# Patient Record
Sex: Male | Born: 1985
Health system: Southern US, Community
[De-identification: ages and names within clinical notes are randomized; demographics above are authoritative.]

## PROBLEM LIST (undated history)

## (undated) ENCOUNTER — Ambulatory Visit (HOSPITAL_COMMUNITY): Admission: EM | Discharge status: Absent without leave | Payer: Managed Care, Other (non HMO)

## (undated) DIAGNOSIS — J45909 Unspecified asthma, uncomplicated: Secondary | ICD-10-CM

## (undated) HISTORY — PX: HERNIA REPAIR: SHX51

---
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
## 2010-02-17 ENCOUNTER — Emergency Department (HOSPITAL_COMMUNITY): Admission: EM | Admit: 2010-02-17 | Discharge: 2010-02-17 | Payer: Self-pay | Admitting: Emergency Medicine

## 2011-03-17 ENCOUNTER — Emergency Department (HOSPITAL_COMMUNITY)
Admission: EM | Admit: 2011-03-17 | Discharge: 2011-03-17 | Disposition: A | Discharge status: Home / Self Care | Payer: BC Managed Care – PPO | Attending: Emergency Medicine | Admitting: Emergency Medicine

## 2011-03-17 ENCOUNTER — Other Ambulatory Visit: Payer: Self-pay

## 2011-03-17 ENCOUNTER — Emergency Department (HOSPITAL_COMMUNITY): Payer: BC Managed Care – PPO

## 2011-03-17 DIAGNOSIS — R059 Cough, unspecified: Secondary | ICD-10-CM | POA: Insufficient documentation

## 2011-03-17 DIAGNOSIS — R05 Cough: Secondary | ICD-10-CM | POA: Insufficient documentation

## 2011-03-17 DIAGNOSIS — J45901 Unspecified asthma with (acute) exacerbation: Secondary | ICD-10-CM

## 2011-03-17 DIAGNOSIS — R000 Tachycardia, unspecified: Secondary | ICD-10-CM | POA: Insufficient documentation

## 2011-03-17 DIAGNOSIS — R55 Syncope and collapse: Secondary | ICD-10-CM | POA: Insufficient documentation

## 2011-03-17 DIAGNOSIS — R0789 Other chest pain: Secondary | ICD-10-CM | POA: Insufficient documentation

## 2011-03-17 DIAGNOSIS — R0602 Shortness of breath: Secondary | ICD-10-CM | POA: Insufficient documentation

## 2011-03-17 MED ORDER — ALBUTEROL SULFATE (5 MG/ML) 0.5% IN NEBU
2.5000 mg | INHALATION_SOLUTION | Freq: Once | RESPIRATORY_TRACT | Status: AC
  Administered 2011-03-17: 2.5 mg via RESPIRATORY_TRACT
  Filled 2011-03-17: qty 0.5

## 2011-03-17 MED ORDER — ALBUTEROL SULFATE (5 MG/ML) 0.5% IN NEBU
2.5000 mg | INHALATION_SOLUTION | Freq: Once | RESPIRATORY_TRACT | Status: AC
  Administered 2011-03-17: 5 mg via RESPIRATORY_TRACT
  Filled 2011-03-17: qty 1

## 2011-03-17 MED ORDER — IPRATROPIUM BROMIDE 0.02 % IN SOLN
0.5000 mg | Freq: Once | RESPIRATORY_TRACT | Status: AC
  Administered 2011-03-17: 0.5 mg via RESPIRATORY_TRACT
  Filled 2011-03-17: qty 2.5

## 2011-03-17 MED ORDER — METHYLPREDNISOLONE SODIUM SUCC 125 MG IJ SOLR
125.0000 mg | Freq: Once | INTRAMUSCULAR | Status: AC
  Administered 2011-03-17: 125 mg via INTRAVENOUS
  Filled 2011-03-17: qty 2

## 2011-03-17 MED ORDER — PREDNISONE 10 MG PO TABS: 50.0000 mg | ORAL_TABLET | Freq: Every day | ORAL | Status: AC

## 2011-03-17 MED ORDER — ALBUTEROL SULFATE (5 MG/ML) 0.5% IN NEBU
2.5000 mg | INHALATION_SOLUTION | Freq: Once | RESPIRATORY_TRACT | Status: AC
  Administered 2011-03-17: 2.5 mg via RESPIRATORY_TRACT
  Filled 2011-03-17: qty 1

## 2011-03-17 MED ORDER — ALBUTEROL SULFATE HFA 108 (90 BASE) MCG/ACT IN AERS
2.0000 | INHALATION_SPRAY | Freq: Four times a day (QID) | RESPIRATORY_TRACT | Status: DC
  Administered 2011-03-17: 2 via RESPIRATORY_TRACT
  Filled 2011-03-17: qty 6.7

## 2011-03-17 NOTE — ED Notes (Signed)
Pt reports sob and wheezing with coughing x2 weeks. States he had asthma as a child but has not had any problems recently until now. States this feels like an asthma attack.

## 2011-03-17 NOTE — ED Notes (Signed)
Patient is resting comfortably. 

## 2011-03-17 NOTE — ED Provider Notes (Signed)
History     CSN: [id number]  Arrival date & time 03/17/11  1538   First MD Initiated Contact with Patient 03/17/11 1558      Chief Complaint  Patient presents with  . Shortness of Breath    wheezing.  hx asthma as a child  . Cough    clear sputum    (Consider location/radiation/quality/duration/timing/severity/associated sxs/prior treatment) HPI The patient presents with shortness of breath. He notes the gradual onset of symptoms approximately 10 days ago. Since onset his symptoms have been persistent, worsening clear provoking factor. No relief with anything. The patient is concurrent tightness in his chest, focally about the sternum. No exertional worsening of this pain, nor any nausea/vomiting. The patient notes that after he was particularly short of breath yesterday he had a syncopal episode. He denies any head pain prior to or following the event. With the patient has a history of childhood asthma, but no current medication use. He recalls that this episode is similar to those he experienced as a child History reviewed. No pertinent past medical history.  Past Surgical History  Procedure Date  . Hernia repair     History reviewed. No pertinent family history.  History  Substance Use Topics  . Smoking status: Current Everyday Smoker -- 1.0 packs/day    Types: Cigarettes  . Smokeless tobacco: Not on file  . Alcohol Use: Yes     40 oz every few days      Review of Systems  Constitutional:       Per HPI, otherwise negative  HENT:       Per HPI, otherwise negative  Eyes: Negative.   Respiratory:       Per HPI, otherwise negative  Cardiovascular:       Per HPI, otherwise negative  Gastrointestinal: Negative for vomiting.  Genitourinary: Negative.   Musculoskeletal:       Per HPI, otherwise negative  Skin: Negative.   Neurological: Negative for syncope.    Allergies  Review of patient's allergies indicates no known allergies.  Home Medications  No  current outpatient prescriptions on file.  BP 142/80  Pulse 102  Temp(Src) 98.8 F (37.1 C) (Oral)  Resp 22  SpO2 99%  Physical Exam  Nursing note and vitals reviewed. Constitutional: He is oriented to person, place, and time. He appears well-developed. No distress.  HENT:  Head: Normocephalic and atraumatic.  Eyes: Conjunctivae and EOM are normal.  Cardiovascular: Regular rhythm.  Tachycardia present.   Pulmonary/Chest: Effort normal. No accessory muscle usage or stridor. Not tachypneic. No respiratory distress. He has decreased breath sounds. He has wheezes.  Abdominal: He exhibits no distension.  Musculoskeletal: He exhibits no edema.  Neurological: He is alert and oriented to person, place, and time.  Skin: Skin is warm and dry.  Psychiatric: He has a normal mood and affect.    ED Course  Procedures (including critical care time)  Labs Reviewed - No data to display No results found.   No diagnosis found.  Cardiac monitor 88, sinus rhythm, normal Pulse ox 90%, room air, normal  Date: 03/17/2011  Rate: 86  Rhythm: normal sinus rhythm  QRS Axis: borderline RAD  Intervals: normal  ST/T Wave abnormalities: normal  Conduction Disutrbances:none  Narrative Interpretation:   Old EKG Reviewed: none available [place] [facility] - abnormal ECG  5:24 PM Patient notes improvement.  Lungs sound more clear.  Neb #2 ordered  6:21 PM Patient is breathing w/o wheezing, no stridor, and tolerating PO w/o discomfort.  CXR: reviewed by me, no acute findings MDM  Male with a distant history of asthma now presents with several days of shortness of breath and cough. Initially the patient has notable bilateral wheezing. Following albuterol treatments, Solu-Medrol the patient's breathing improved substantially. He was discharged home in stable condition with an albuterol inhaler        Gerhard Munch, MD 03/17/11 [phone]

## 2011-03-17 NOTE — ED Notes (Signed)
Family at bedside. 

## 2011-03-17 NOTE — ED Notes (Signed)
Pt reports 2 days of increased SOB and sternal chest pain that is worse with deep inspiration. Chest pain radiatess to shoulders and back. Pt denies dyspnea on exertion and nausea. Chest pain is associated with dizziness and weakness.

## 2011-08-17 ENCOUNTER — Emergency Department (HOSPITAL_COMMUNITY): Payer: No Typology Code available for payment source

## 2011-08-17 ENCOUNTER — Encounter (HOSPITAL_COMMUNITY): Payer: Self-pay | Admitting: Emergency Medicine

## 2011-08-17 ENCOUNTER — Emergency Department (HOSPITAL_COMMUNITY)
Admission: EM | Admit: 2011-08-17 | Discharge: 2011-08-17 | Disposition: A | Discharge status: Home / Self Care | Payer: No Typology Code available for payment source | Attending: Emergency Medicine | Admitting: Emergency Medicine

## 2011-08-17 DIAGNOSIS — M542 Cervicalgia: Secondary | ICD-10-CM | POA: Insufficient documentation

## 2011-08-17 DIAGNOSIS — M545 Low back pain, unspecified: Secondary | ICD-10-CM | POA: Insufficient documentation

## 2011-08-17 DIAGNOSIS — S161XXA Strain of muscle, fascia and tendon at neck level, initial encounter: Secondary | ICD-10-CM

## 2011-08-17 DIAGNOSIS — S139XXA Sprain of joints and ligaments of unspecified parts of neck, initial encounter: Secondary | ICD-10-CM | POA: Insufficient documentation

## 2011-08-17 DIAGNOSIS — S39012A Strain of muscle, fascia and tendon of lower back, initial encounter: Secondary | ICD-10-CM

## 2011-08-17 DIAGNOSIS — IMO0001 Reserved for inherently not codable concepts without codable children: Secondary | ICD-10-CM | POA: Insufficient documentation

## 2011-08-17 DIAGNOSIS — S335XXA Sprain of ligaments of lumbar spine, initial encounter: Secondary | ICD-10-CM | POA: Insufficient documentation

## 2011-08-17 DIAGNOSIS — M7989 Other specified soft tissue disorders: Secondary | ICD-10-CM | POA: Insufficient documentation

## 2011-08-17 DIAGNOSIS — F17200 Nicotine dependence, unspecified, uncomplicated: Secondary | ICD-10-CM | POA: Insufficient documentation

## 2011-08-17 MED ORDER — IBUPROFEN 800 MG PO TABS: 800.0000 mg | ORAL_TABLET | Freq: Three times a day (TID) | ORAL | Status: AC | PRN

## 2011-08-17 MED ORDER — KETOROLAC TROMETHAMINE 60 MG/2ML IM SOLN
60.0000 mg | Freq: Once | INTRAMUSCULAR | Status: AC
  Administered 2011-08-17: 60 mg via INTRAMUSCULAR
  Filled 2011-08-17: qty 2

## 2011-08-17 MED ORDER — CYCLOBENZAPRINE HCL 10 MG PO TABS: 10.0000 mg | ORAL_TABLET | Freq: Three times a day (TID) | ORAL | Status: AC | PRN

## 2011-08-17 MED ORDER — HYDROCODONE-ACETAMINOPHEN 5-325 MG PO TABS
1.0000 | ORAL_TABLET | Freq: Once | ORAL | Status: AC
  Administered 2011-08-17: 1 via ORAL
  Filled 2011-08-17: qty 1

## 2011-08-17 MED ORDER — HYDROCODONE-ACETAMINOPHEN 5-325 MG PO TABS: 1.0000 | ORAL_TABLET | Freq: Four times a day (QID) | ORAL | Status: AC | PRN
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
## 2011-08-17 NOTE — Discharge Instructions (Signed)
Return here as needed for any worsening in her condition.  Use ice and heat on her neck and back.  You can expect to be sore for the next 7-10 days
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
## 2011-08-18 NOTE — ED Provider Notes (Signed)
Medical screening examination/treatment/procedure(s) were performed by non-physician practitioner and as supervising physician I was immediately available for consultation/collaboration.  Rebel Willcutt L Calyx Hawker, MD 08/18/11 0904 

## 2016-07-09 ENCOUNTER — Encounter (HOSPITAL_COMMUNITY): Payer: Self-pay | Admitting: *Deleted

## 2016-07-09 ENCOUNTER — Emergency Department (HOSPITAL_COMMUNITY)
Admission: EM | Admit: 2016-07-09 | Discharge: 2016-07-09 | Disposition: A | Discharge status: Home / Self Care | Payer: Self-pay | Attending: Emergency Medicine | Admitting: Emergency Medicine

## 2016-07-09 DIAGNOSIS — R369 Urethral discharge, unspecified: Secondary | ICD-10-CM | POA: Insufficient documentation

## 2016-07-09 DIAGNOSIS — F17210 Nicotine dependence, cigarettes, uncomplicated: Secondary | ICD-10-CM | POA: Insufficient documentation

## 2016-07-09 DIAGNOSIS — F1290 Cannabis use, unspecified, uncomplicated: Secondary | ICD-10-CM | POA: Insufficient documentation

## 2016-07-09 LAB — URINALYSIS, ROUTINE W REFLEX MICROSCOPIC
BILIRUBIN URINE: NEGATIVE
GLUCOSE, UA: NEGATIVE mg/dL
Hgb urine dipstick: NEGATIVE
Ketones, ur: NEGATIVE mg/dL
NITRITE: NEGATIVE
PH: 5 (ref 5.0–8.0)
Protein, ur: NEGATIVE mg/dL
RBC / HPF: NONE SEEN RBC/hpf (ref 0–5)
SPECIFIC GRAVITY, URINE: 1.015 (ref 1.005–1.030)
SQUAMOUS EPITHELIAL / LPF: NONE SEEN

## 2016-07-09 MED ORDER — CEFTRIAXONE SODIUM 250 MG IJ SOLR
250.0000 mg | Freq: Once | INTRAMUSCULAR | Status: AC
  Administered 2016-07-09: 250 mg via INTRAMUSCULAR
  Filled 2016-07-09: qty 250

## 2016-07-09 MED ORDER — AZITHROMYCIN 250 MG PO TABS
1000.0000 mg | ORAL_TABLET | Freq: Once | ORAL | Status: AC
  Administered 2016-07-09: 1000 mg via ORAL
  Filled 2016-07-09: qty 4

## 2016-07-09 NOTE — ED Triage Notes (Signed)
Pt c/o penile discharge, recently diagnosed with chlamydia, states that he was given a shot of medication but symptoms have remained,

## 2016-07-09 NOTE — ED Notes (Signed)
No answer in waiting room X1,  

## 2016-07-09 NOTE — ED Provider Notes (Signed)
[facility]-EMER[facility]ENCY DEPT Provider Note   CSN: [id number] Arrival date & time: 07/09/16  2040     History   Chief Complaint Chief Complaint  Patient presents with  . Penile Discharge    HPI Dennis Salinas is a 31 y.o. male who presents with 4 days of worsening penile discharge. He states that discharge is yellow in color. He reports that he was treated for chlamydia a few weeks ago and had one shot of medication for it. He reports that he has had sexual intercourse since then but has used condoms. He denies any hematuria, dysuria, penile swelling, testicular pain or swelling, abdominal pain or vomiting.   The history is provided by the patient.    History reviewed. No pertinent past medical history.  There are no active problems to display for this patient.   Past Surgical History:  Procedure Laterality Date  . HERNIA REPAIR         Home Medications    Prior to Admission medications   Not on File    Family History No family history on file.  Social History Social History  Substance Use Topics  . Smoking status: Current Every Day Smoker    Packs/day: 1.00    Types: Cigarettes  . Smokeless tobacco: Never Used  . Alcohol use Yes     Comment: 40 oz every few days     Allergies   Patient has no known allergies.   Review of Systems Review of Systems  Constitutional: Negative for fever.  Respiratory: Negative for shortness of breath.   Cardiovascular: Negative for chest pain.  Gastrointestinal: Negative for nausea and vomiting.  Genitourinary: Positive for discharge. Negative for dysuria, hematuria, penile pain, penile swelling, scrotal swelling and testicular pain.     Physical Exam Updated Vital Signs BP (!) 151/88 (BP Location: Right Arm)   Pulse 81   Temp 98.5 F (36.9 C) (Oral)   Resp 20   Ht  (1.905 m)   Wt 122.5 kg   SpO2 96%   BMI 33.75 kg/m   Physical Exam  Constitutional: He appears well-developed and well-nourished.  HENT:    Head: Normocephalic and atraumatic.  Eyes: Conjunctivae and EOM are normal. Right eye exhibits no discharge. Left eye exhibits no discharge. No scleral icterus.  Pulmonary/Chest: Effort normal.  Abdominal: Soft. Hernia confirmed negative in the right inguinal area and confirmed negative in the left inguinal area.  Genitourinary: Testes normal and penis normal. Right testis shows no swelling and no tenderness. Left testis shows no swelling and no tenderness. Circumcised. No penile tenderness. No discharge found.  Genitourinary Comments: The exam was performed with a chaperone present. No ulcers or sores noted to penis. No discharge noted.   Musculoskeletal: He exhibits no deformity.  Neurological: He is alert.  Skin: Skin is warm and dry.  Psychiatric: He has a normal mood and affect. His speech is normal and behavior is normal.     ED Treatments / Results  Labs (all labs ordered are listed, but only abnormal results are displayed) Labs Reviewed  URINALYSIS, ROUTINE W REFLEX MICROSCOPIC - Abnormal; Notable for the following:       Result Value   Leukocytes, UA TRACE (*)    Bacteria, UA RARE (*)    All other components within normal limits  RPR  HIV ANTIBODY (ROUTINE TESTING)  GC/CHLAMYDIA PROBE AMP ([place]) NOT AT [facility]    EKG  EKG Interpretation None       Radiology No results found.  Procedures Procedures (including critical care time)  Medications Ordered in ED Medications  cefTRIAXone (ROCEPHIN) injection 250 mg (250 mg Intramuscular Given 07/09/16 2216)  azithromycin (ZITHROMAX) tablet 1,000 mg (1,000 mg Oral Given 07/09/16 2216)     Initial Impression / Assessment and Plan / ED Course  I have reviewed the triage vital signs and the nursing notes.  Pertinent labs & imaging results that were available during my care of the patient were reviewed by me and considered in my medical decision making (see chart for details).     31 yo M presents with penile  discharge. Recently treated for chlamydia. Questionable if treated for gonorrhea at the same time since he only remembers getting on medication. Normal GU exam. Will check STI panel on him. Since he is symptomatic will plan to treat him in the department.    Instructed patient to follow-up with PCP in 2 days. Provided patient with a list of clinic resources to use if he does not have a PCP. Instructed to call them today to arrange follow-up in the next 24-48 hours.  Return precautions discussed. Patient expresses understanding and agreement to plan.    Final Clinical Impressions(s) / ED Diagnoses   Final diagnoses:  Penile discharge    New Prescriptions There are no discharge medications for this patient.    Maxwell Caul, PA-C 07/10/16 0131    Bethann Berkshire, MD 07/15/16 [phone]

## 2016-07-09 NOTE — Discharge Instructions (Signed)
We have treated you today for a sexually transmitted infection since you are having symptoms. The results of the test will take a few days to come back. You will get a call if anything is abnormal or you can use the mychart system to access your results.   Follow-up with a primary care doctor in 3-4 days as needed.   Return to the Emergency Department for any worsening pain, swelling, fevers, or any other concerns.   If you do not have a primary care doctor you see regularly, please you the list below. Please call them to arrange for follow-up.    No Primary Care Doctor Call [facility]  [phone] Other agencies that provide inexpensive medical care    [facility]  [phone]    [facility]  [phone]    [facility]  [phone]    [facility]  [phone]    [facility]  [phone]    [facility]  [phone]

## 2016-07-09 NOTE — ED Notes (Signed)
No answer in waiting room,  

## 2016-07-11 LAB — GC/CHLAMYDIA PROBE AMP (~~LOC~~) NOT AT ARMC
Chlamydia: NEGATIVE
Neisseria Gonorrhea: NEGATIVE

## 2016-07-11 LAB — HIV ANTIBODY (ROUTINE TESTING W REFLEX): HIV Screen 4th Generation wRfx: NONREACTIVE

## 2016-07-11 LAB — RPR: RPR Ser Ql: NONREACTIVE

## 2016-11-21 ENCOUNTER — Emergency Department (HOSPITAL_COMMUNITY): Payer: No Typology Code available for payment source

## 2016-11-21 ENCOUNTER — Emergency Department (HOSPITAL_COMMUNITY)
Admission: EM | Admit: 2016-11-21 | Discharge: 2016-11-21 | Disposition: A | Discharge status: Home / Self Care | Payer: No Typology Code available for payment source | Attending: Emergency Medicine | Admitting: Emergency Medicine

## 2016-11-21 ENCOUNTER — Encounter (HOSPITAL_COMMUNITY): Payer: Self-pay | Admitting: *Deleted

## 2016-11-21 DIAGNOSIS — S61211A Laceration without foreign body of left index finger without damage to nail, initial encounter: Secondary | ICD-10-CM | POA: Diagnosis not present

## 2016-11-21 DIAGNOSIS — Z23 Encounter for immunization: Secondary | ICD-10-CM | POA: Insufficient documentation

## 2016-11-21 DIAGNOSIS — Y998 Other external cause status: Secondary | ICD-10-CM | POA: Diagnosis not present

## 2016-11-21 DIAGNOSIS — S61217A Laceration without foreign body of left little finger without damage to nail, initial encounter: Secondary | ICD-10-CM | POA: Diagnosis present

## 2016-11-21 DIAGNOSIS — Y92410 Unspecified street and highway as the place of occurrence of the external cause: Secondary | ICD-10-CM | POA: Diagnosis not present

## 2016-11-21 DIAGNOSIS — Y939 Activity, unspecified: Secondary | ICD-10-CM | POA: Insufficient documentation

## 2016-11-21 DIAGNOSIS — F17210 Nicotine dependence, cigarettes, uncomplicated: Secondary | ICD-10-CM | POA: Insufficient documentation

## 2016-11-21 MED ORDER — TETANUS-DIPHTH-ACELL PERTUSSIS 5-2.5-18.5 LF-MCG/0.5 IM SUSP
0.5000 mL | Freq: Once | INTRAMUSCULAR | Status: AC
  Administered 2016-11-21: 0.5 mL via INTRAMUSCULAR
  Filled 2016-11-21: qty 0.5

## 2016-11-21 MED ORDER — ACETAMINOPHEN 325 MG PO TABS
325.0000 mg | ORAL_TABLET | Freq: Once | ORAL | Status: AC
  Administered 2016-11-21: 325 mg via ORAL
  Filled 2016-11-21: qty 1

## 2016-11-21 MED ORDER — OXYCODONE-ACETAMINOPHEN 5-325 MG PO TABS
ORAL_TABLET | ORAL | Status: AC
  Filled 2016-11-21: qty 1

## 2016-11-21 MED ORDER — OXYCODONE-ACETAMINOPHEN 5-325 MG PO TABS
1.0000 | ORAL_TABLET | Freq: Once | ORAL | Status: AC
  Administered 2016-11-21: 1 via ORAL
  Filled 2016-11-21: qty 1

## 2016-11-21 MED ORDER — LIDOCAINE HCL (PF) 1 % IJ SOLN
30.0000 mL | Freq: Once | INTRAMUSCULAR | Status: AC
  Administered 2016-11-21: 30 mL
  Filled 2016-11-21: qty 30

## 2016-11-21 MED ORDER — OXYCODONE-ACETAMINOPHEN 5-325 MG PO TABS
1.0000 | ORAL_TABLET | ORAL | Status: DC | PRN
  Administered 2016-11-21: 1 via ORAL

## 2016-11-21 NOTE — ED Notes (Signed)
Claudia PA in room to suture pt's hand.
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
## 2016-11-21 NOTE — ED Notes (Signed)
Pt verbalized understanding of discharge instructions and has no further questions. [facility]. VSS, NAD. Patient discharged with a family member to drive

## 2016-11-21 NOTE — ED Notes (Signed)
Pt verbalized understanding of d/c instructions and has no further questions. Pt stable and nAD, VSS. Pt removed all belongings from room.

## 2016-11-21 NOTE — ED Notes (Signed)
Pt left hand soaking in betadine and sterile saline
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
## 2016-11-21 NOTE — ED Notes (Signed)
Pt transported to xray 

## 2016-12-13 ENCOUNTER — Encounter (HOSPITAL_COMMUNITY): Payer: Self-pay

## 2016-12-13 ENCOUNTER — Emergency Department (HOSPITAL_COMMUNITY)
Admission: EM | Admit: 2016-12-13 | Discharge: 2016-12-13 | Disposition: A | Discharge status: Home / Self Care | Payer: Self-pay | Attending: Emergency Medicine | Admitting: Emergency Medicine

## 2016-12-13 DIAGNOSIS — F17210 Nicotine dependence, cigarettes, uncomplicated: Secondary | ICD-10-CM | POA: Insufficient documentation

## 2016-12-13 DIAGNOSIS — N342 Other urethritis: Secondary | ICD-10-CM

## 2016-12-13 DIAGNOSIS — N341 Nonspecific urethritis: Secondary | ICD-10-CM | POA: Insufficient documentation

## 2016-12-13 LAB — URINALYSIS, ROUTINE W REFLEX MICROSCOPIC
BILIRUBIN URINE: NEGATIVE
Bacteria, UA: NONE SEEN
GLUCOSE, UA: NEGATIVE mg/dL
HGB URINE DIPSTICK: NEGATIVE
KETONES UR: NEGATIVE mg/dL
NITRITE: NEGATIVE
PH: 5 (ref 5.0–8.0)
PROTEIN: NEGATIVE mg/dL
SQUAMOUS EPITHELIAL / LPF: NONE SEEN
Specific Gravity, Urine: 1.012 (ref 1.005–1.030)

## 2016-12-13 MED ORDER — AZITHROMYCIN 250 MG PO TABS
1000.0000 mg | ORAL_TABLET | Freq: Once | ORAL | Status: AC
  Administered 2016-12-13: 1000 mg via ORAL
  Filled 2016-12-13: qty 4

## 2016-12-13 MED ORDER — LIDOCAINE HCL (PF) 1 % IJ SOLN
5.0000 mL | Freq: Once | INTRAMUSCULAR | Status: AC
  Administered 2016-12-13: 5 mL
  Filled 2016-12-13: qty 5

## 2016-12-13 MED ORDER — CEFTRIAXONE SODIUM 250 MG IJ SOLR
250.0000 mg | Freq: Once | INTRAMUSCULAR | Status: AC
  Administered 2016-12-13: 250 mg via INTRAMUSCULAR
  Filled 2016-12-13: qty 250

## 2016-12-13 NOTE — Discharge Instructions (Signed)
You have been treated in the emergency department for an infection, possibly sexually transmitted. Results of your gonorrhea and chlamydia tests are pending and you will be notified if they are positive. It is very important to practice safe sex and use condoms when sexually active. If your results are positive you need to notify all sexual partners so they can be treated as well. The website https://garcia.net/ can be used to send anonymous text messages or emails to alert sexual contacts. Follow up with your doctor, or OBGYN in regards to today's visit.    Gonorrhea and Chlamydia SYMPTOMS  In females, symptoms may go unnoticed. Symptoms that are more noticeable can include:  Belly (abdominal) pain.  Painful intercourse.  Watery mucous-like discharge from the vagina.  Miscarriage.  Discomfort when urinating.  Inflammation of the rectum.  Abnormal gray-green frothy vaginal discharge  Vaginal itching and irritatio  Itching and irritation of the area outside the vagina.   Painful urination.  Bleeding after sexual intercourse.  In males, symptoms include:  Burning with urination.  Pain in the testicles.  Watery mucous-like discharge from the penis.  It can cause longstanding (chronic) pelvic pain after frequent infections.  TREATMENT  PID can cause women to not be able to have children (sterile) if left untreated or if half-treated.  It is important to finish ALL medications given to you.  This is a sexually transmitted infection. So you are also at risk for other sexually transmitted diseases, including HIV (AIDS), it is recommended that you get tested. HOME CARE INSTRUCTIONS  Warning: This infection is contagious. Do not have sex until treatment is completed. Follow up at your caregiver's office or the clinic to which you were referred. If your diagnosis (learning what is wrong) is confirmed by culture or some other method, your recent sexual contacts need treatment. Even if they are  symptom free or have a negative culture or evaluation, they should be treated.  PREVENTION  Women should use sanitary pads instead of tampons for vaginal discharge.  Wipe front to back after using the toilet and avoid douching.   Practice safe sex, use condoms, have only one sex partner and be sure your sex partner is not having sex with others.  Ask your caregiver to test you for chlamydia at your regular checkups or sooner if you are having symptoms.  Ask for further information if you are pregnant.  SEEK IMMEDIATE MEDICAL CARE IF:  You develop an oral temperature above 102 F (38.9 C), not controlled by medications or lasting more than 2 days.  You develop an increase in pain.  You develop any type of abnormal discharge.  You develop vaginal bleeding and it is not time for your period.  You develop painful intercourse.   Free HIV and STD Testing These locations offer FREE confidential testing for HIV, Chlamydia, Gonorrhea, and Syphilis. Non-Traditional Testing Sites Address Telephone  [facility] [address] [place] [phone] Mondays 5pm - 7pm  [facility] [address] [place] [phone] Wednesdays 2pm-8pm  [employer] [facility] [address] [place] [phone] Thursdays 9am-12noon 1pm-4pm  [facility] [address] [place] [phone] Tuesdays Thursdays 9am-12noon 1pm-4pm  [facility] Department of [employer] offers free, confidential testing and treatment for HIV, Chlamydia, Gonorrhea, Syphilis, Herpes, Bacterial Vaginosis, Yeast, and Trichomoniasis. Traditional Testing   [facility]  Department-[place] [facility] [address], [place] [phone]  Monday thru Friday  Call for an appointment  [facility] Department- [place] [address] [place] [phone] Monday thru Friday  Call for anappointment.  If you have any questions about this information please call [phone]. 01/30/2011

## 2016-12-13 NOTE — ED Triage Notes (Signed)
Pt states he noticed clear discharge from his penis on Monday. No hx of STD but gets checked frequently and has been treated prophylactic in the past. He reports he would like to be checked and treated if needed.

## 2016-12-13 NOTE — ED Notes (Signed)
Pt reports clear penile discharge x 3 days.

## 2016-12-13 NOTE — ED Provider Notes (Signed)
[facility] Provider Note   CSN: [id number] Arrival date & time: 12/13/16  1034     History   Chief Complaint Chief Complaint  Patient presents with  . Exposure to STD    HPI Tequan Redmon is a 31 y.o. male who presents emergency Department with chief complaint of penile discharge. Patient has had 2 episodes of unprotected sexual intercourse with new partners. He noticed some clear discharge from his penis 2 days ago. He has had several episodes of discharge and this morning noticed some milky discharge from the penis. He denies any dysuria, scrotal tenderness, testicular tenderness, lesions.  HPI  History reviewed. No pertinent past medical history.  There are no active problems to display for this patient.   Past Surgical History:  Procedure Laterality Date  . HERNIA REPAIR         Home Medications    Prior to Admission medications   Not on File    Family History History reviewed. No pertinent family history.  Social History Social History  Substance Use Topics  . Smoking status: Current Every Day Smoker    Packs/day: 1.00    Types: Cigarettes  . Smokeless tobacco: Never Used  . Alcohol use Yes     Comment: 40 oz every few days     Allergies   Patient has no known allergies.   Review of Systems Review of Systems  Constitutional: Negative for chills and fever.  Genitourinary: Positive for discharge. Negative for genital sores.     Physical Exam Updated Vital Signs BP 133/77 (BP Location: Left Arm)   Pulse 64   Temp 98.1 F (36.7 C) (Oral)   Resp 20   SpO2 100%   Physical Exam  Constitutional: He appears well-developed and well-nourished. No distress.  HENT:  Head: Normocephalic and atraumatic.  Eyes: Conjunctivae are normal. No scleral icterus.  Neck: Normal range of motion. Neck supple.  Cardiovascular: Normal rate, regular rhythm and normal heart sounds.   Pulmonary/Chest: Effort normal and breath sounds normal. No  respiratory distress.  Abdominal: Soft. There is no tenderness.  Genitourinary:  Genitourinary Comments: Normal male genitalia, circumcised. No discharge at the meatus, no lesions, no testicular tenderness tenderness or swelling.  Musculoskeletal: He exhibits no edema.  Neurological: He is alert.  Skin: Skin is warm and dry. He is not diaphoretic.  Psychiatric: His behavior is normal.  Nursing note and vitals reviewed.    ED Treatments / Results  Labs (all labs ordered are listed, but only abnormal results are displayed) Labs Reviewed  URINALYSIS, ROUTINE W REFLEX MICROSCOPIC - Abnormal; Notable for the following:       Result Value   Leukocytes, UA SMALL (*)    All other components within normal limits  RPR  HIV ANTIBODY (ROUTINE TESTING)  GC/CHLAMYDIA PROBE AMP ([place]) NOT AT [facility]    EKG  EKG Interpretation None       Radiology No results found.  Procedures Procedures (including critical care time)  Medications Ordered in ED Medications  cefTRIAXone (ROCEPHIN) injection 250 mg (not administered)  azithromycin (ZITHROMAX) tablet 1,000 mg (not administered)  lidocaine (PF) (XYLOCAINE) 1 % injection 5 mL (not administered)     Initial Impression / Assessment and Plan / ED Course  I have reviewed the triage vital signs and the nursing notes.  Pertinent labs & imaging results that were available during my care of the patient were reviewed by me and considered in my medical decision making (see chart for details).  Patient treated in the ED for STI with Rocephin and azithromycin. Patient advised to inform and treat all sexual partners.  Pt advised on safe sex practices and understands that they have GC/Chlamydia cultures pending and will result in 2-3 days. HIV and RPR sent. Pt encouraged to follow up at local health department for future STI checks. No concern for prostatitis or epididymitis. Discussed return precautions. Pt appears safe for discharge.     Final Clinical Impressions(s) / ED Diagnoses   Final diagnoses:  Urethritis    New Prescriptions New Prescriptions   No medications on file     Delos Haring 12/13/16 1234    Benjiman Core, MD 12/13/16 [phone]

## 2016-12-14 LAB — RPR: RPR: NONREACTIVE

## 2016-12-14 LAB — HIV ANTIBODY (ROUTINE TESTING W REFLEX): HIV SCREEN 4TH GENERATION: NONREACTIVE

## 2017-10-20 DIAGNOSIS — K645 Perianal venous thrombosis: Secondary | ICD-10-CM | POA: Diagnosis not present

## 2017-11-24 ENCOUNTER — Ambulatory Visit: Payer: Self-pay | Admitting: Surgery

## 2017-11-24 DIAGNOSIS — K642 Third degree hemorrhoids: Secondary | ICD-10-CM | POA: Diagnosis not present

## 2017-11-24 NOTE — H&P (Signed)
Dennis Salinas Documented: 11/24/2017 9:11 AM Location: [facility] Patient #: [id number] DOB: 12-28-1985 Single / Language: [place] / Race: Black or African American Male  History of Present Illness [name] MD; 11/24/2017 12:36 PM) The patient is a 32 year old male who presents with hemorrhoids. Note for "Hemorrhoids": ` ` ` Patient sent for surgical consultation at the request of Dr Via  Chief Complaint: Prolapsing hemorrhoids with recent thrombosed episode ` ` The patient is a pleasant healthy male. X football player. He is struggle with hemorrhoid flares in his life. They can pop out and be irritating. Sometimes has to push them back in. Usually it is tolerable. However, last month he had an episode with a painful thrombosed hemorrhoid. It took some time but eventually went down. Given the fact that his father needed hemorrhoid surgery, he wished to consider surgical evaluation to see if something more aggressive can happen. He moves his bowels about every 2-3 times a day. Had an umbilical hernia surgery when he was 32 years old but otherwise no abdominal surgeries. He has not required any intervention such as banding or lysis of thrombosed hemorrhoids.  No personal nor family history of GI/colon cancer, inflammatory bowel disease, irritable bowel syndrome, allergy such as Celiac Sprue, dietary/dairy problems, colitis, ulcers nor gastritis. No recent sick contacts/gastroenteritis. No travel outside the country. No changes in diet. No dysphagia to solids or liquids. No significant heartburn or reflux. No hematochezia, hematemesis, coffee ground emesis. No evidence of prior gastric/peptic ulceration.  (Review of systems as stated in this history (HPI) or in the review of systems. Otherwise all other 12 point ROS are negative) ` ` `   Past Surgical History [name], CMA; 11/24/2017 9:11 AM) No pertinent past surgical history  Diagnostic  Studies History [name], CMA; 11/24/2017 9:11 AM) Colonoscopy never  Allergies [name], CMA; 11/24/2017 9:12 AM) No Known Drug Allergies [11/24/2017]:  Medication History [name], CMA; 11/24/2017 9:12 AM) No Current Medications  Social History [name], CMA; 11/24/2017 9:11 AM) Alcohol use Occasional alcohol use. Caffeine use Coffee, Tea. Tobacco use Current every day smoker.  Family History [name], [place]; 11/24/2017 9:11 AM) Diabetes Mellitus Mother. Hypertension Father, Mother.  Other Problems [name], CMA; 11/24/2017 9:11 AM) Asthma Hemorrhoids Umbilical Hernia Repair     Review of Systems [name] CMA; 11/24/2017 9:11 AM) General Not Present- Appetite Loss, Chills, Fatigue, Fever, Night Sweats, Weight Gain and Weight Loss. Skin Not Present- Change in Wart/Mole, Dryness, Hives, Jaundice, New Lesions, Non-Healing Wounds, Rash and Ulcer. HEENT Present- Seasonal Allergies and Wears glasses/contact lenses. Not Present- Earache, Hearing Loss, Hoarseness, Nose Bleed, Oral Ulcers, Ringing in the Ears, Sinus Pain, Sore Throat, Visual Disturbances and Yellow Eyes. Respiratory Not Present- Bloody sputum, Chronic Cough, Difficulty Breathing, Snoring and Wheezing. Breast Not Present- Breast Mass, Breast Pain, Nipple Discharge and Skin Changes. Cardiovascular Not Present- Chest Pain, Difficulty Breathing Lying Down, Leg Cramps, Palpitations, Rapid Heart Rate, Shortness of Breath and Swelling of Extremities. Gastrointestinal Present- Bloody Stool, Hemorrhoids and Rectal Pain. Not Present- Abdominal Pain, Bloating, Change in Bowel Habits, Chronic diarrhea, Constipation, Difficulty Swallowing, Excessive gas, Gets full quickly at meals, Indigestion, Nausea and Vomiting. Male Genitourinary Not Present- Blood in Urine, Change in Urinary Stream, Frequency, Impotence, Nocturia, Painful Urination, Urgency and Urine Leakage. Musculoskeletal Not  Present- Back Pain, Joint Pain, Joint Stiffness, Muscle Pain, Muscle Weakness and Swelling of Extremities. Neurological Not Present- Decreased Memory, Fainting, Headaches, Numbness, Seizures, Tingling, Tremor, Trouble walking and  Weakness. Psychiatric Not Present- Anxiety, Bipolar, Change in Sleep Pattern, Depression, Fearful and Frequent crying. Endocrine Not Present- Cold Intolerance, Excessive Hunger, Hair Changes, Heat Intolerance, Hot flashes and New Diabetes. Hematology Not Present- Blood Thinners, Easy Bruising, Excessive bleeding, Gland problems, HIV and Persistent Infections.  Vitals [name] CMA; 11/24/2017 9:13 AM) 11/24/2017 9:12 AM Weight: 286.6 lb Height: 74in Body Surface Area: 2.53 m Body Mass Index: 36.8 kg/m  Temp.: 97.67F  Pulse: 80 (Regular)  BP: 128/82 (Sitting, Left Arm, Standard)      Physical Exam [name] MD; 11/24/2017 9:26 AM)  General Mental Status-Alert. General Appearance-Not in acute distress, Not Sickly. Orientation-Oriented X3. Hydration-Well hydrated. Voice-Normal.  Integumentary Global Assessment Upon inspection and palpation of skin surfaces of the - Axillae: non-tender, no inflammation or ulceration, no drainage. and Distribution of scalp and body hair is normal. General Characteristics Temperature - normal warmth is noted.  Head and Neck Head-normocephalic, atraumatic with no lesions or palpable masses. Face Global Assessment - atraumatic, no absence of expression. Neck Global Assessment - no abnormal movements, no bruit auscultated on the right, no bruit auscultated on the left, no decreased range of motion, non-tender. Trachea-midline. Thyroid Gland Characteristics - non-tender.  Eye Eyeball - Left-Extraocular movements intact, No Nystagmus. Eyeball - Right-Extraocular movements intact, No Nystagmus. Cornea - Left-No Hazy. Cornea - Right-No Hazy. Sclera/Conjunctiva - Left-No scleral  icterus, No Discharge. Sclera/Conjunctiva - Right-No scleral icterus, No Discharge. Pupil - Left-Direct reaction to light normal. Pupil - Right-Direct reaction to light normal.  ENMT Ears Pinna - Left - no drainage observed, no generalized tenderness observed. Right - no drainage observed, no generalized tenderness observed. Nose and Sinuses External Inspection of the Nose - no destructive lesion observed. Inspection of the nares - Left - quiet respiration. Right - quiet respiration. Mouth and Throat Lips - Upper Lip - no fissures observed, no pallor noted. Lower Lip - no fissures observed, no pallor noted. Nasopharynx - no discharge present. Oral Cavity/Oropharynx - Tongue - no dryness observed. Oral Mucosa - no cyanosis observed. Hypopharynx - no evidence of airway distress observed.  Chest and Lung Exam Inspection Movements - Normal and Symmetrical. Accessory muscles - No use of accessory muscles in breathing. Palpation Palpation of the chest reveals - Non-tender. Auscultation Breath sounds - Normal and Clear.  Cardiovascular Auscultation Rhythm - Regular. Murmurs & Other Heart Sounds - Auscultation of the heart reveals - No Murmurs and No Systolic Clicks.  Abdomen Inspection Inspection of the abdomen reveals - No Visible peristalsis and No Abnormal pulsations. Umbilicus - No Bleeding, No Urine drainage. Palpation/Percussion Palpation and Percussion of the abdomen reveal - Soft, Non Tender, No Rebound tenderness, No Rigidity (guarding) and No Cutaneous hyperesthesia. Note: Abdomen soft. Nontender. Not distended. No umbilical or incisional hernias. No guarding.  Male Genitourinary Sexual Maturity Tanner 5 - Adult hair pattern and Adult penile size and shape. Note: No inguinal hernias. Normal external genitalia. Epididymi, testes, and spermatic cords normal without any masses.  Rectal Note: ` ` ` Perianal skin clear. Normal sphincter tone. No fissure or fistula.  No external hemorrhoids. Can somewhat tolerate digital exam but sensitive. Not able see with bedside anoscopy given large body size. No pilonidal disease. No obvious prolapsed or thrombosed hemorrhoid. Feels some fullness especially right posterior and left lateral hemorrhoidal piles.  Peripheral Vascular Upper Extremity Inspection - Left - No Cyanotic nailbeds, Not Ischemic. Right - No Cyanotic nailbeds, Not Ischemic.  Neurologic Neurologic evaluation reveals -normal attention span and ability to concentrate,  able to name objects and repeat phrases. Appropriate fund of knowledge , normal sensation and normal coordination. Mental Status Affect - not angry, not paranoid. Cranial Nerves-Normal Bilaterally. Gait-Normal.  Neuropsychiatric Mental status exam performed with findings of-able to articulate well with normal speech/language, rate, volume and coherence, thought content normal with ability to perform basic computations and apply abstract reasoning and no evidence of hallucinations, delusions, obsessions or homicidal/suicidal ideation.  Musculoskeletal Global Assessment Spine, Ribs and Pelvis - no instability, subluxation or laxity. Right Upper Extremity - no instability, subluxation or laxity.  Lymphatic Head & Neck  General Head & Neck Lymphatics: Bilateral - Description - No Localized lymphadenopathy. Axillary  General Axillary Region: Bilateral - Description - No Localized lymphadenopathy. Femoral & Inguinal  Generalized Femoral & Inguinal Lymphatics: Left - Description - No Localized lymphadenopathy. Right - Description - No Localized lymphadenopathy.    Assessment & Plan [name] MD; 11/24/2017 9:47 AM)  PROLAPSED INTERNAL HEMORRHOIDS, GRADE 3 (K64.2) Impression: Pleasant gentleman intermittently prolapsing hemorrhoids with a recent severe flare of a thrombosed hemorrhoid. Cannot get adequate examination in office. Risks of the differential diagnosis seems  unlikely.  I recommended examination under anesthesia with probable hemorrhoidal ligation/pexy. Possible resection of any redundant hemorrhoidal tissue. Usually injection/Manning is not adequate for prolapsed hemorrhoids anyway. Long discussion with patient and his mother. They are interested in proceeding.  I did caution he will need several weeks out before returning to his primarily does work job.  Current Plans You are being scheduled for surgery- Our schedulers will call you.  You should hear from our office's scheduling department within 5 working days about the location, date, and time of surgery. We try to make accommodations for patient's preferences in scheduling surgery, but sometimes the OR schedule or the surgeon's schedule prevents [place] from making those accommodations.  If you have not heard from our office [phone]) in 5 working days, call the office and ask for your surgeon's nurse.  If you have other questions about your diagnosis, plan, or surgery, call the office and ask for your surgeon's nurse.  Pt Education - Pamphlet Given - The Hemorrhoid Book: discussed with patient and provided information. The anatomy & physiology of the anorectal region was discussed. The pathophysiology of hemorrhoids and differential diagnosis was discussed. Natural history risks without surgery was discussed. I stressed the importance of a bowel regimen to have daily soft bowel movements to minimize progression of disease. Interventions such as sclerotherapy & banding were discussed.  The patient's symptoms are not adequately controlled by medicines and other non-operative treatments. I feel the risks & problems of no surgery outweigh the operative risks; therefore, I recommended surgery to treat the hemorrhoids by ligation, pexy, and possible resection.  Risks such as bleeding, infection, urinary difficulties, need for further treatment, heart attack, death, and other risks were  discussed. I noted a good likelihood this will help address the problem. Goals of post-operative recovery were discussed as well. Possibility that this will not correct all symptoms was explained. Post-operative pain, bleeding, constipation, and other problems after surgery were discussed. We will work to minimize complications. Educational handouts further explaining the pathology, treatment options, and bowel regimen were given as well. Questions were answered. The patient expresses understanding & wishes to proceed with surgery.  Pt Education - [facility] Hemorrhoids (Lovelle Deitrick): discussed with patient and provided information.  ENCOUNTER FOR PREOPERATIVE EXAMINATION FOR GENERAL SURGICAL PROCEDURE (Z01.818)  Current Plans Pt Education - [facility] Rectal Prep for Anorectal outpatient/office surgery: discussed with  patient and provided information. Pt Education - [facility] Rectal Surgery HCI (Haddie Bruhl): discussed with patient and provided information. Pt Education - [facility] Good Bowel Health (Vladislav Axelson)

## 2018-04-23 ENCOUNTER — Encounter (HOSPITAL_COMMUNITY): Payer: Self-pay

## 2018-04-23 ENCOUNTER — Emergency Department (HOSPITAL_COMMUNITY)
Admission: EM | Admit: 2018-04-23 | Discharge: 2018-04-23 | Disposition: A | Discharge status: Home / Self Care | Payer: Managed Care, Other (non HMO) | Attending: Emergency Medicine | Admitting: Emergency Medicine

## 2018-04-23 ENCOUNTER — Other Ambulatory Visit: Payer: Self-pay

## 2018-04-23 DIAGNOSIS — J45901 Unspecified asthma with (acute) exacerbation: Secondary | ICD-10-CM | POA: Insufficient documentation

## 2018-04-23 DIAGNOSIS — X500XXA Overexertion from strenuous movement or load, initial encounter: Secondary | ICD-10-CM | POA: Diagnosis not present

## 2018-04-23 DIAGNOSIS — Y998 Other external cause status: Secondary | ICD-10-CM | POA: Insufficient documentation

## 2018-04-23 DIAGNOSIS — F17210 Nicotine dependence, cigarettes, uncomplicated: Secondary | ICD-10-CM | POA: Insufficient documentation

## 2018-04-23 DIAGNOSIS — Y9239 Other specified sports and athletic area as the place of occurrence of the external cause: Secondary | ICD-10-CM | POA: Diagnosis not present

## 2018-04-23 DIAGNOSIS — Y93B1 Activity, exercise machines primarily for muscle strengthening: Secondary | ICD-10-CM | POA: Diagnosis not present

## 2018-04-23 DIAGNOSIS — S46811A Strain of other muscles, fascia and tendons at shoulder and upper arm level, right arm, initial encounter: Secondary | ICD-10-CM | POA: Insufficient documentation

## 2018-04-23 DIAGNOSIS — S4991XA Unspecified injury of right shoulder and upper arm, initial encounter: Secondary | ICD-10-CM | POA: Diagnosis present

## 2018-04-23 MED ORDER — NAPROXEN 500 MG PO TABS
500.0000 mg | ORAL_TABLET | Freq: Once | ORAL | Status: AC
  Administered 2018-04-23: 500 mg via ORAL
  Filled 2018-04-23: qty 1

## 2018-04-23 MED ORDER — PREDNISONE 20 MG PO TABS: 40.0000 mg | ORAL_TABLET | Freq: Every day | ORAL | 0 refills | Status: AC

## 2018-04-23 MED ORDER — OXYMETAZOLINE HCL 0.05 % NA SOLN
1.0000 | Freq: Once | NASAL | Status: AC
  Administered 2018-04-23: 1 via NASAL
  Filled 2018-04-23: qty 15

## 2018-04-23 MED ORDER — NAPROXEN 500 MG PO TABS: 500.0000 mg | ORAL_TABLET | Freq: Three times a day (TID) | ORAL | 0 refills | Status: AC

## 2018-04-23 MED ORDER — CYCLOBENZAPRINE HCL 10 MG PO TABS: 10.0000 mg | ORAL_TABLET | Freq: Two times a day (BID) | ORAL | 0 refills | Status: DC | PRN

## 2018-04-23 MED ORDER — ALBUTEROL SULFATE HFA 108 (90 BASE) MCG/ACT IN AERS
1.0000 | INHALATION_SPRAY | Freq: Once | RESPIRATORY_TRACT | Status: AC
  Administered 2018-04-23: 1 via RESPIRATORY_TRACT
  Filled 2018-04-23: qty 6.7

## 2018-04-23 MED ORDER — PREDNISONE 20 MG PO TABS
60.0000 mg | ORAL_TABLET | Freq: Once | ORAL | Status: AC
  Administered 2018-04-23: 60 mg via ORAL
  Filled 2018-04-23: qty 3

## 2018-04-23 MED ORDER — ACETAMINOPHEN 500 MG PO TABS
1000.0000 mg | ORAL_TABLET | Freq: Once | ORAL | Status: AC
  Administered 2018-04-23: 1000 mg via ORAL
  Filled 2018-04-23: qty 2

## 2018-04-23 MED ORDER — CYCLOBENZAPRINE HCL 10 MG PO TABS: 10.0000 mg | ORAL_TABLET | Freq: Three times a day (TID) | ORAL | 0 refills | Status: AC

## 2018-04-23 MED ORDER — ALBUTEROL SULFATE (2.5 MG/3ML) 0.083% IN NEBU
5.0000 mg | INHALATION_SOLUTION | Freq: Once | RESPIRATORY_TRACT | Status: AC
  Administered 2018-04-23: 5 mg via RESPIRATORY_TRACT
  Filled 2018-04-23: qty 6

## 2018-04-23 NOTE — ED Provider Notes (Signed)
[facility]-EMERGENCY DEPT Provider Note   CSN: [id number] Arrival date & time: 04/23/18  2134     History   Chief Complaint Chief Complaint  Patient presents with  . Asthma  . Back Pain    HPI Dennis Salinas is a 33 y.o. male is here for evaluation of neck pain and asthma attack.  Reports nasal congestion, dry cough for several days. Persistent.  Today while at work he started coughing and wheezing, developed chest tightness.  He has not had an asthma attack in several years but symptoms today feel similar.  +tobacco use.  He received duoneb in triage and symptoms significantly improved.  He denies fevers, chills, sore throat, CP or SOB.  No aggravating factors. No known sick contacts.  Pt had sudden onset right upper neck/thoracic back pain 1 week ago while at the gym doing lat pull downs.  Has been intermittently taking advil, massage, heat pad without relief. The pain is sharp, worse with neck movements.  Difficult to sleep.  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  No HA, neck stiffness, loss of sensation or strength unilaterally. No alleviating factors.   HPI  History reviewed. No pertinent past medical history.  There are no active problems to display for this patient.   Past Surgical History:  Procedure Laterality Date  . HERNIA REPAIR          Home Medications    Prior to Admission medications   Medication Sig Start Date End Date Taking? Authorizing Provider  cyclobenzaprine (FLEXERIL) 10 MG tablet Take 1 tablet (10 mg total) by mouth 3 (three) times daily for 5 days. 04/23/18 04/28/18  Liberty HandyGibbons, Cline Draheim J, PA-C  naproxen (NAPROSYN) 500 MG tablet Take 1 tablet (500 mg total) by mouth 3 (three) times daily with meals for 5 days. 04/23/18 04/28/18  Liberty HandyGibbons, Denorris Reust J, PA-C  predniSONE (DELTASONE) 20 MG tablet Take 2 tablets (40 mg total) by mouth daily for 4 days. 04/23/18 04/27/18  Liberty HandyGibbons, Johnpatrick Jenny J, PA-C     Family History History reviewed. No pertinent family history.  Social History Social History   Tobacco Use  . Smoking status: Current Every Day Smoker    Packs/day: 1.00    Types: Cigarettes  . Smokeless tobacco: Never Used  Substance Use Topics  . Alcohol use: Yes    Comment: 40 oz every few days  . Drug use: Yes    Types: Marijuana     Allergies   Patient has no known allergies.   Review of Systems Review of Systems  HENT: Positive for congestion, postnasal drip and rhinorrhea.   Respiratory: Positive for cough, chest tightness and wheezing.   Musculoskeletal: Positive for myalgias and neck pain.  All other systems reviewed and are negative.    Physical Exam Updated Vital Signs BP 119/89   Pulse 96   Temp 98.5 F (36.9 C) (Oral)   Resp 16   SpO2 95%   Physical Exam Vitals signs and nursing note reviewed.  Constitutional:      General: He is not in acute distress.    Appearance: He is well-developed.     Comments: NAD.  HENT:     Head: Normocephalic and atraumatic.     Right Ear: External ear normal.     Left Ear: External ear normal.     Nose: Nose normal.  Eyes:     General: No scleral icterus.    Conjunctiva/sclera: Conjunctivae normal.  Neck:     Musculoskeletal: Normal range of motion and neck supple. Muscular tenderness present.     Comments: c-spine: no midline tenderness. Moderate right paraspinal muscle tenderness along trapezius attachment to right occipital prominence.  Full ROM of neck without rigidity but pain with bend and flexion Cardiovascular:     Rate and Rhythm: Normal rate and regular rhythm.     Heart sounds: Normal heart sounds. No murmur.  Pulmonary:     Effort: Pulmonary effort is normal.     Breath sounds: Normal breath sounds. No wheezing.  Musculoskeletal: Normal range of motion.        General: Tenderness present. No deformity.     Comments: T spine: no midline tenderness. R sided trapezius tenderness with increased  tone. Full ROM of shoulder, full strength against resistance and sensation.   Skin:    General: Skin is warm and dry.     Capillary Refill: Capillary refill takes less than 2 seconds.  Neurological:     Mental Status: He is alert and oriented to person, place, and time.  Psychiatric:        Behavior: Behavior normal.        Thought Content: Thought content normal.        Judgment: Judgment normal.      ED Treatments / Results  Labs (all labs ordered are listed, but only abnormal results are displayed) Labs Reviewed - No data to display  EKG None  Radiology No results found.  Procedures Procedures (including critical care time)  Medications Ordered in ED Medications  albuterol (PROVENTIL) (2.5 MG/3ML) 0.083% nebulizer solution 5 mg (5 mg Nebulization Given 04/23/18 2140)  predniSONE (DELTASONE) tablet 60 mg (60 mg Oral Given 04/23/18 2213)  albuterol (PROVENTIL HFA;VENTOLIN HFA) 108 (90 Base) MCG/ACT inhaler 1-2 puff (1 puff Inhalation Given 04/23/18 2213)  naproxen (NAPROSYN) tablet 500 mg (500 mg Oral Given 04/23/18 2213)  acetaminophen (TYLENOL) tablet 1,000 mg (1,000 mg Oral Given 04/23/18 2213)  oxymetazoline (AFRIN) 0.05 % nasal spray 1 spray (1 spray Each Nare Given 04/23/18 2233)     Initial Impression / Assessment and Plan / ED Course  I have reviewed the triage vital signs and the nursing notes.  Pertinent labs & imaging results that were available during my care of the patient were reviewed by me and considered in my medical decision making (see chart for details).     Pt presents with with URI sxs, chest tightness, wheezing. H/o asthma and tobacco abuse. On exam, pt is non toxic appearing with normal breathing effort. No fever, no tachypnea, no tachycardia, normal oxygen saturations. Wheezing improved after ED tx with breathing tx. Given reassuring exam w/o fever, tachypnea, hypoxia CXR not indicated.  Likely viral URI in setting of asthma exacerbation. Will tx  symptoms conservatively plus albuterol and prednisone. ED return precautions given.   Neck pain appears to be MSK in nature strain vs spasm or both.  No midline spinous tenderness, neck rigidity, fever, unlikely to be fx or meningitis.  No radicular symptoms. Strength and sensation intact bilaterally.  Will dc with flexeril, NSAID, massage, heat. F/u with PT for persistent symptoms.   Final Clinical Impressions(s) / ED Diagnoses   Final diagnoses:  Asthma exacerbation, mild  Strain of right trapezius muscle, initial encounter    ED Discharge Orders         Ordered    cyclobenzaprine (FLEXERIL) 10 MG tablet  2 times daily PRN,   Status:  Discontinued  04/23/18 2219    naproxen (NAPROSYN) 500 MG tablet  3 times daily with meals     04/23/18 2219    cyclobenzaprine (FLEXERIL) 10 MG tablet  3 times daily     04/23/18 2221    predniSONE (DELTASONE) 20 MG tablet  Daily     04/23/18 2222           Liberty Handy, [place] 04/23/18 2313    Derwood Kaplan, MD 04/25/18 [phone]

## 2018-04-23 NOTE — Discharge Instructions (Addendum)
You were seen in the ER for cough, asthma flare.   You likely have a viral upper respiratory infection that led to a mild asthma flare.  Use albuterol inhaler every 4-6 hours as needed for chest tightness and wheezing.  Finish prednisone as prescribed.  Oxymetazoline (afrin) intranasal spray once daily for no more than 3 days to help with congestion, after 3 days you can switch to another over-the-counter nasal steroid spray such as fluticasone (flonase).  Phenylephrine (sudafed) to help with nasal congestion.  Dextromethorphan (Delsym) to suppress dry cough.  A viral upper respiratory infection can also worsen and progress into pneumonia.  Monitor your symptoms. Return for persistent fevers, chest pain, productive cough, inability to tolerate fluids despite nausea medicines or dehydration   You also reported right-sided neck and back pain.  This is likely a muscle strain or spasm.  Take naproxen every 8 hours plus [phone] mg acetaminophen (tylenol) every 8 hours.  Flexeril for spasms and tightness every 8 hours. Heat, massage, stretch.  Follow up with primary care doctor for further assessment.  Continued pain may be managed by physical therapy. Return for loss of sensation or strength to extremities.

## 2018-04-23 NOTE — ED Triage Notes (Signed)
Pt reports an asthma attack that started earlier at work. He needs a refill on his inhaler. Pt also reports that he thinks that he pinched a nerve at the gym because he is experiencing upper R sided back pain. A&Ox4.

## 2018-06-28 ENCOUNTER — Emergency Department (HOSPITAL_COMMUNITY)
Admission: EM | Admit: 2018-06-28 | Discharge: 2018-06-28 | Disposition: A | Discharge status: Home / Self Care | Payer: Managed Care, Other (non HMO) | Attending: Emergency Medicine | Admitting: Emergency Medicine

## 2018-06-28 ENCOUNTER — Encounter (HOSPITAL_COMMUNITY): Payer: Self-pay

## 2018-06-28 ENCOUNTER — Emergency Department (HOSPITAL_COMMUNITY): Payer: Managed Care, Other (non HMO)

## 2018-06-28 DIAGNOSIS — R030 Elevated blood-pressure reading, without diagnosis of hypertension: Secondary | ICD-10-CM

## 2018-06-28 DIAGNOSIS — R0789 Other chest pain: Secondary | ICD-10-CM | POA: Diagnosis not present

## 2018-06-28 DIAGNOSIS — J4521 Mild intermittent asthma with (acute) exacerbation: Secondary | ICD-10-CM | POA: Diagnosis not present

## 2018-06-28 DIAGNOSIS — F17210 Nicotine dependence, cigarettes, uncomplicated: Secondary | ICD-10-CM | POA: Diagnosis not present

## 2018-06-28 DIAGNOSIS — R0600 Dyspnea, unspecified: Secondary | ICD-10-CM | POA: Diagnosis present

## 2018-06-28 HISTORY — DX: Unspecified asthma, uncomplicated: J45.909

## 2018-06-28 LAB — CBC WITH DIFFERENTIAL/PLATELET
Abs Immature Granulocytes: 0.01 10*3/uL (ref 0.00–0.07)
Basophils Absolute: 0.1 10*3/uL (ref 0.0–0.1)
Basophils Relative: 1 %
Eosinophils Absolute: 0.1 10*3/uL (ref 0.0–0.5)
Eosinophils Relative: 3 %
HCT: 48.3 % (ref 39.0–52.0)
Hemoglobin: 16.3 g/dL (ref 13.0–17.0)
Immature Granulocytes: 0 %
Lymphocytes Relative: 31 %
Lymphs Abs: 1.6 10*3/uL (ref 0.7–4.0)
MCH: 32.2 pg (ref 26.0–34.0)
MCHC: 33.7 g/dL (ref 30.0–36.0)
MCV: 95.5 fL (ref 80.0–100.0)
Monocytes Absolute: 0.4 10*3/uL (ref 0.1–1.0)
Monocytes Relative: 8 %
Neutro Abs: 2.9 10*3/uL (ref 1.7–7.7)
Neutrophils Relative %: 57 %
Platelets: 192 10*3/uL (ref 150–400)
RBC: 5.06 MIL/uL (ref 4.22–5.81)
RDW: 13.3 % (ref 11.5–15.5)
WBC: 5.1 10*3/uL (ref 4.0–10.5)
nRBC: 0 % (ref 0.0–0.2)

## 2018-06-28 LAB — BASIC METABOLIC PANEL
Anion gap: 7 (ref 5–15)
BUN: 11 mg/dL (ref 6–20)
CO2: 24 mmol/L (ref 22–32)
Calcium: 9.1 mg/dL (ref 8.9–10.3)
Chloride: 106 mmol/L (ref 98–111)
Creatinine, Ser: 0.94 mg/dL (ref 0.61–1.24)
GFR calc Af Amer: >60 mL/min (ref >60)
GFR calc non Af Amer: >60 mL/min (ref >60)
Glucose, Bld: 114 mg/dL — ABNORMAL HIGH (ref 70–99)
Potassium: 3.6 mmol/L (ref 3.5–5.1)
Sodium: 137 mmol/L (ref 135–145)

## 2018-06-28 LAB — D-DIMER, QUANTITATIVE: D-Dimer, Quant: <0.27 ug/mL-FEU (ref 0.00–0.50)

## 2018-06-28 LAB — I-STAT TROPONIN, ED: Troponin i, poc: 0.01 ng/mL (ref 0.00–0.08)

## 2018-06-28 MED ORDER — IPRATROPIUM BROMIDE 0.03 % NA SOLN: 2.0000 | Freq: Two times a day (BID) | NASAL | 12 refills | Status: DC

## 2018-06-28 MED ORDER — ONDANSETRON 4 MG PO TBDP
4.0000 mg | ORAL_TABLET | Freq: Once | ORAL | Status: AC
  Administered 2018-06-28: 09:00:00 4 mg via ORAL
  Filled 2018-06-28: qty 1

## 2018-06-28 MED ORDER — PREDNISONE 10 MG (21) PO TBPK: ORAL_TABLET | Freq: Every day | ORAL | 0 refills | Status: DC

## 2018-06-28 MED ORDER — PREDNISONE 20 MG PO TABS
60.0000 mg | ORAL_TABLET | Freq: Once | ORAL | Status: AC
  Administered 2018-06-28: 60 mg via ORAL
  Filled 2018-06-28: qty 3

## 2018-06-28 MED ORDER — ALBUTEROL SULFATE HFA 108 (90 BASE) MCG/ACT IN AERS: 1.0000 | INHALATION_SPRAY | Freq: Four times a day (QID) | RESPIRATORY_TRACT | 0 refills | Status: DC | PRN

## 2018-06-28 MED ORDER — ALBUTEROL SULFATE (2.5 MG/3ML) 0.083% IN NEBU: 5.0000 mg | INHALATION_SOLUTION | Freq: Once | RESPIRATORY_TRACT | Status: DC

## 2018-06-28 MED ORDER — IPRATROPIUM BROMIDE HFA 17 MCG/ACT IN AERS
8.0000 | INHALATION_SPRAY | Freq: Once | RESPIRATORY_TRACT | Status: AC
  Administered 2018-06-28: 8 via RESPIRATORY_TRACT
  Filled 2018-06-28: qty 12.9

## 2018-06-28 MED ORDER — ALBUTEROL SULFATE HFA 108 (90 BASE) MCG/ACT IN AERS
8.0000 | INHALATION_SPRAY | Freq: Once | RESPIRATORY_TRACT | Status: AC
  Administered 2018-06-28: 8 via RESPIRATORY_TRACT
  Filled 2018-06-28: qty 6.7

## 2018-06-28 NOTE — Discharge Instructions (Addendum)
You were seen in the ED for cough, chest tightness. Labs, chest x-ray are normal.  I suspect a virus flared your asthma.  Use prednisone taper as prescribed.  Use albuterol inhaler every 4 hours (2 puffs each).  Ipratropium is another inhaler that helps with mucus, this can be expensive.  Use Goodrx for discounts if your insurance doesn't cover it. Rest. Stay hydrated.   Your blood pressure was elevated today. Monitor this once daily.  Follow up with primary care doctor for evaluation if it remains persistently greater than 140/50.   Given current outbreak, we will manage symptoms under presumed COVID-19 (coronavirus) infection.  It is also possible you could have other viral upper respiratory infection.  There was no medical necessity for formal testing.  Testing would not have changed our management.  Management will include self-isolation, monitoring of symptoms and supportive care with over-the-counter medicines.  Return to the ED if there is increased work of breathing, shortness of breath, inability to tolerate fluids, weakness, chest pain.  The Department of Public Health is recommending the following isolation recommendations.  Track your fever. All 3 of the following must be met:  At least 7 days since symptom onset 72 hours of absence of fever without antifever medicine (ibuprofen, acetaminophen). A fever is temperature of 100.1F or greater. Improvement of respiratory symptoms   Stay well-hydrated. Rest. You can use over the counter medications to help with symptoms: 600 mg ibuprofen (motrin, aleve, advil) or acetaminophen (tylenol) every 6 hours, around the clock to help with associated fevers, sore throat, headaches, generalized body aches and malaise.  Oxymetazoline (afrin) intranasal spray once daily for no more than 3 days to help with congestion, after 3 days you can switch to another over-the-counter nasal steroid spray such as fluticasone (flonase) Allergy medication (loratadine,  cetirizine, etc) and phenylephrine (sudafed) help with nasal congestion, runny nose and postnasal drip.   Dextromethorphan (Delsym) to suppress dry cough. Frequent coughing is likely causing your chest wall pain Wash your hands often to prevent spread.    Infection Prevention Recommendations for Individuals Confirmed to have, or Being Evaluated for, or have symptoms of 2019 Novel Coronavirus (COVID-19) Infection Who Receive Care at Home  Individuals who are confirmed to have, or are being evaluated for, COVID-19 should follow the prevention steps below until a healthcare provider or local or state health department says they can return to normal activities.  Stay home except to get medical care You should restrict activities outside your home, except for getting medical care. Do not go to work, school, or public areas, and do not use public transportation or taxis.  Call ahead before visiting your doctor Before your medical appointment, call the healthcare provider and tell them that you have, or are being evaluated for, COVID-19 infection. This will help the healthcare providers office take steps to keep other people from getting infected. Ask your healthcare provider to call the local or state health department.  Monitor your symptoms Seek prompt medical attention if your illness is worsening (e.g., difficulty breathing). Before going to your medical appointment, call the healthcare provider and tell them that you have, or are being evaluated for, COVID-19 infection. Ask your healthcare provider to call the local or state health department.  Wear a facemask You should wear a facemask that covers your nose and mouth when you are in the same room with other people and when you visit a healthcare provider. People who live with or visit you should also wear a  facemask while they are in the same room with you.  Separate yourself from other people in your home As much as possible, you should  stay in a different room from other people in your home. Also, you should use a separate bathroom, if available.  Avoid sharing household items You should not share dishes, drinking glasses, cups, eating utensils, towels, bedding, or other items with other people in your home. After using these items, you should wash them thoroughly with soap and water.  Cover your coughs and sneezes Cover your mouth and nose with a tissue when you cough or sneeze, or you can cough or sneeze into your sleeve. Throw used tissues in a lined trash can, and immediately wash your hands with soap and water for at least 20 seconds or use an alcohol-based hand rub.  Wash your [facility] your hands often and thoroughly with soap and water for at least 20 seconds. You can use an alcohol-based hand sanitizer if soap and water are not available and if your hands are not visibly dirty. Avoid touching your eyes, nose, and mouth with unwashed hands.   Prevention Steps for Caregivers and Household Members of Individuals Confirmed to have, or Being Evaluated for, or have symptoms of 2019 Novel Coronavirus (COVID-19) Infection Being Cared for in the Home  If you live with, or provide care at home for, a person confirmed to have, or being evaluated for, COVID-19 infection please follow these guidelines to prevent infection:  Follow healthcare providers instructions Make sure that you understand and can help the patient follow any healthcare provider instructions for all care.  Provide for the patients basic needs You should help the patient with basic needs in the home and provide support for getting groceries, prescriptions, and other personal needs.  Monitor the patients symptoms If they are getting sicker, call his or her medical provider and tell them that the patient has, or is being evaluated for, COVID-19 infection. This will help the healthcare providers office take steps to keep other people from getting  infected. Ask the healthcare provider to call the local or state health department.  Limit the number of people who have contact with the patient If possible, have only one caregiver for the patient. Other household members should stay in another home or place of residence. If this is not possible, they should stay in another room, or be separated from the patient as much as possible. Use a separate bathroom, if available. Restrict visitors who do not have an essential need to be in the home.  Keep older adults, very young children, and other sick people away from the patient Keep older adults, very young children, and those who have compromised immune systems or chronic health conditions away from the patient. This includes people with chronic heart, lung, or kidney conditions, diabetes, and cancer.  Ensure good ventilation Make sure that shared spaces in the home have good air flow, such as from an air conditioner or an opened window, weather permitting.  Wash your hands often Wash your hands often and thoroughly with soap and water for at least 20 seconds. You can use an alcohol based hand sanitizer if soap and water are not available and if your hands are not visibly dirty. Avoid touching your eyes, nose, and mouth with unwashed hands. Use disposable paper towels to dry your hands. If not available, use dedicated cloth towels and replace them when they become wet.  Wear a facemask and gloves Wear a disposable  facemask at all times in the room and gloves when you touch or have contact with the patients blood, body fluids, and/or secretions or excretions, such as sweat, saliva, sputum, nasal mucus, vomit, urine, or feces.  Ensure the mask fits over your nose and mouth tightly, and do not touch it during use. Throw out disposable facemasks and gloves after using them. Do not reuse. Wash your hands immediately after removing your facemask and gloves. If your personal clothing becomes  contaminated, carefully remove clothing and launder. Wash your hands after handling contaminated clothing. Place all used disposable facemasks, gloves, and other waste in a lined container before disposing them with other household waste. Remove gloves and wash your hands immediately after handling these items.  Do not share dishes, glasses, or other household items with the patient Avoid sharing household items. You should not share dishes, drinking glasses, cups, eating utensils, towels, bedding, or other items with a patient who is confirmed to have, or being evaluated for, COVID-19 infection. After the person uses these items, you should wash them thoroughly with soap and water.  Wash laundry thoroughly Immediately remove and wash clothes or bedding that have blood, body fluids, and/or secretions or excretions, such as sweat, saliva, sputum, nasal mucus, vomit, urine, or feces, on them. Wear gloves when handling laundry from the patient. Read and follow directions on labels of laundry or clothing items and detergent. In general, wash and dry with the warmest temperatures recommended on the label.  Clean all areas the individual has used often Clean all touchable surfaces, such as counters, tabletops, doorknobs, bathroom fixtures, toilets, phones, keyboards, tablets, and bedside tables, every day. Also, clean any surfaces that may have blood, body fluids, and/or secretions or excretions on them. Wear gloves when cleaning surfaces the patient has come in contact with. Use a diluted bleach solution (e.g., dilute bleach with 1 part bleach and 10 parts water) or a household disinfectant with a label that says EPA-registered for coronaviruses. To make a bleach solution at home, add 1 tablespoon of bleach to 1 quart (4 cups) of water. For a larger supply, add  cup of bleach to 1 gallon (16 cups) of water. Read labels of cleaning products and follow recommendations provided on product labels. Labels  contain instructions for safe and effective use of the cleaning product including precautions you should take when applying the product, such as wearing gloves or eye protection and making sure you have good ventilation during use of the product. Remove gloves and wash hands immediately after cleaning.  Monitor yourself for signs and symptoms of illness Caregivers and household members are considered close contacts, should monitor their health, and will be asked to limit movement outside of the home to the extent possible. Follow the monitoring steps for close contacts listed on the symptom monitoring form.  ? If you have additional questions, contact your local health department or call the epidemiologist on call at [phone] (available 24/7). ? This guidance is subject to change. For the most up-to-date guidance from [facility], please refer to their website: [email or web address].pl

## 2018-06-28 NOTE — ED Triage Notes (Addendum)
Patient states he woke up this morning with tightness in his left chest from asthma and feels as if he is going to have an asthma attack.   Also c/o productive cough (Clear)  X 10days. Patient states he has seasonal allergies.   Patient is almost out of inhaler and was not working well for him this morning.   Patient states he was fine last night before going to bed.  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 Last traveled to [place] on Saturday.   Former smoker recently quit    A/Ox4 Able to speak in complete sentences.  Ambulatory in room.

## 2018-06-28 NOTE — ED Notes (Signed)
Patient ambulated to room from triage 

## 2018-06-28 NOTE — ED Notes (Signed)
Patient given discharge teaching and verbalized understanding. Patient ambulated out of ED with a steady gait. 

## 2018-06-28 NOTE — ED Provider Notes (Addendum)
[facility]-EMERGENCY DEPT Provider Note   CSN: [id number] Arrival date & time: 06/28/18  0804    History   Chief Complaint Chief Complaint  Patient presents with  . Asthma    HPI Dennis Salinas is a 33 y.o. male with history of asthma, tobacco use, marijuana use, is here for evaluation of "difficulty breathing".  Describes feeling like there is resistance when he takes deep breaths, it is mildly painful.  This sensation is located to the left upper/mid chest.  It is a constant discomfort but worse with breathing, mildly painful with inspiration.  Associated with chest tightness.  States for the last 10 days he has had a mildly productive cough, rhinorrhea, resolved sore throat, resolved headache that he initially attributed to his allergies and asthma.  Had albuterol inhaler that he was trying to make last and has been only using it once a day for the last 10 days.  States in the past he has had some cough and wheezing from his asthma but today it feels different.  He has never had chest tightness when he takes deep breaths.  Takes a daily Allegra.  Stopped smoking 2 months ago.  Patient works in [employer] and frequently travels.  In the last 1 month he has driven to [place], [place], [place] in [place].  His longest car trip was 5 hours to [place] without breaks.  Required hospitalization for asthma flare during childhood.  No intubations.  He denies any fever, nausea, vomiting, abdominal pain, myalgias.  Had multiple nonbloody non-melanotic loose stools throughout the night.  No history of DVT/PEs.  No recent surgeries.  Denies lower extremity swelling or calf pain, hemoptysis, hormonal supplements.     HPI  Past Medical History:  Diagnosis Date  . Asthma     There are no active problems to display for this patient.   Past Surgical History:  Procedure Laterality Date  . HERNIA REPAIR          Home Medications    Prior to  Admission medications   Medication Sig Start Date End Date Taking? Authorizing Provider  albuterol (PROVENTIL HFA;VENTOLIN HFA) 108 (90 Base) MCG/ACT inhaler Inhale 1-2 puffs into the lungs every 6 (six) hours as needed for wheezing or shortness of breath. 06/28/18   Liberty Handy, PA-C  ipratropium (ATROVENT) 0.03 % nasal spray Place 2 sprays into both nostrils every 12 (twelve) hours. 06/28/18   Liberty Handy, PA-C  predniSONE (STERAPRED UNI-PAK 21 TAB) 10 MG (21) TBPK tablet Take by mouth daily. Take 6 tabs by mouth daily  for 2 days, then 5 tabs for 2 days, then 4 tabs for 2 days, then 3 tabs for 2 days, 2 tabs for 2 days, then 1 tab by mouth daily for 2 days 06/28/18   Liberty Handy, PA-C    Family History History reviewed. No pertinent family history.  Social History Social History   Tobacco Use  . Smoking status: Current Every Day Smoker    Packs/day: 1.00    Types: Cigarettes  . Smokeless tobacco: Never Used  Substance Use Topics  . Alcohol use: Yes    Comment: 40 oz every few days  . Drug use: Yes    Types: Marijuana     Allergies   Patient has no known allergies.   Review of Systems Review of Systems  HENT: Positive for rhinorrhea.   Respiratory: Positive for cough and shortness of breath.   Cardiovascular: Positive for chest pain.  Gastrointestinal: Positive for diarrhea.  All other systems reviewed and are negative.    Physical Exam Updated Vital Signs BP 118/81   Pulse 65   Temp 98.4 F (36.9 C)   Resp 15   SpO2 96%   Physical Exam Vitals signs and nursing note reviewed.  Constitutional:      Appearance: He is well-developed.     Comments: Non toxic.  HENT:     Head: Normocephalic and atraumatic.     Nose: Congestion present.     Comments: Moderate mucosal edema and erythema, no rhinorrhea.    Mouth/Throat:     Mouth: Mucous membranes are moist.     Comments: Oropharynx and tonsils normal. Eyes:     Conjunctiva/sclera: Conjunctivae  normal.  Neck:     Musculoskeletal: Normal range of motion.  Cardiovascular:     Rate and Rhythm: Normal rate and regular rhythm.     Heart sounds: Normal heart sounds.     Comments: No reproducible chest wall tenderness. 1+ radial and DP pulses bilaterally. No LE edema or calf tenderness. Pulmonary:     Effort: Pulmonary effort is normal.     Breath sounds: Normal breath sounds.     Comments: Distant lung sounds. No wheezing, crackles.  Abdominal:     General: Bowel sounds are normal.     Palpations: Abdomen is soft.     Tenderness: There is no abdominal tenderness.     Comments: No G/R/R. No suprapubic or CVA tenderness. Negative Murphy's and McBurney's  Musculoskeletal: Normal range of motion.  Skin:    General: Skin is warm and dry.     Capillary Refill: Capillary refill takes less than 2 seconds.  Neurological:     Mental Status: He is alert.  Psychiatric:        Behavior: Behavior normal.      ED Treatments / Results  Labs (all labs ordered are listed, but only abnormal results are displayed) Labs Reviewed  BASIC METABOLIC PANEL - Abnormal; Notable for the following components:      Result Value   Glucose, Bld 114 (*)    All other components within normal limits  CBC WITH DIFFERENTIAL/PLATELET  D-DIMER, QUANTITATIVE (NOT AT [facility])  I-STAT TROPONIN, ED    EKG EKG Interpretation  Date/Time:  Monday June 28 2018 08:25:35 EDT Ventricular Rate:  63 PR Interval:    QRS Duration: 96 QT Interval:  395 QTC Calculation: 405 R Axis:   82 Text Interpretation:  Sinus rhythm No significant change was found Confirmed by Azalia Bilis ([id number]) on 06/28/2018 8:28:03 AM   Radiology Dg Chest Portable 1 View  Result Date: 06/28/2018 CLINICAL DATA:  Onset left chest tightness this morning in a patient with a history of asthma. EXAM: PORTABLE CHEST 1 VIEW COMPARISON:  PA and lateral chest 03/17/2011. FINDINGS: The lungs are clear. Heart size is normal. No pneumothorax or pleural  effusion. No acute focal bony abnormality. IMPRESSION: Negative chest. Electronically Signed   By: Drusilla Kanner M.D.   On: 06/28/2018 09:24    Procedures Procedures (including critical care time)  Medications Ordered in ED Medications  albuterol (PROVENTIL HFA;VENTOLIN HFA) 108 (90 Base) MCG/ACT inhaler 8 puff (8 puffs Inhalation Given 06/28/18 0852)  ipratropium (ATROVENT HFA) inhaler 8 puff (8 puffs Inhalation Given 06/28/18 0853)  predniSONE (DELTASONE) tablet 60 mg (60 mg Oral Given 06/28/18 0853)  ondansetron (ZOFRAN-ODT) disintegrating tablet 4 mg (4 mg Oral Given 06/28/18 0910)     Initial Impression / Assessment and  Plan / ED Course  I have reviewed the triage vital signs and the nursing notes.  Pertinent labs & imaging results that were available during my care of the patient were reviewed by me and considered in my medical decision making (see chart for details).  Clinical Course as of Jun 28 1115  Mon Jun 28, 2018  2878 No h/o HTN, just had physical and normal. No HA, CP, lower extremity edema, PND    [CG]  0844 Admission for asthma as a child   [CG]    Clinical Course User Index [CG] Liberty Handy, PA-C     Initial ddx includes asthma exacerbation from viral illness but initial lung exam wo wheezing.  He could be tight and may benefit from breathing treatment.  Will give MDI equivalent to neb tx, prednisone and reassess. EKG at triage reviewed and unremarkable.    1035: Reassesed patient after breathing tx, prednisone. He reports no improvement in tightness. His tightness is localized to left upper chest, mildly painful with deep breathing.  I still think this may be related to his RAD but given no improvement in symptoms, no wheezing on exam, recent prolonged travel ddx expanded to include PE.  Will obtain labs, trop, d-dimer.  Less likely ACS.  1101: EKG undetectable, given constant pain for > 3 hours feel repeat is not necessary. HEART score<3. Labs unremarkable.  D-dimer negative. VS stable w/o hypoxia.  Will dc with prednisone, MDIs, supportive care. Given pandemic will dc with isolation instructions of 7 days since today and 3 days w/o fever.  Afebrile w/ normal CXR and no leukocytosis, no indication for abx today. Asymptomatic HTN w/o signs of end organ damage, recommended monitoring and f/u with PCP.  Final Clinical Impressions(s) / ED Diagnoses   Final diagnoses:  Chest tightness  Mild intermittent asthma with exacerbation  Elevated blood pressure reading without diagnosis of hypertension    ED Discharge Orders         Ordered    predniSONE (STERAPRED UNI-PAK 21 TAB) 10 MG (21) TBPK tablet  Daily     06/28/18 1113    albuterol (PROVENTIL HFA;VENTOLIN HFA) 108 (90 Base) MCG/ACT inhaler  Every 6 hours PRN     06/28/18 1113    ipratropium (ATROVENT) 0.03 % nasal spray  Every 12 hours     06/28/18 1113            Jerrell Mylar 06/28/18 1119    Azalia Bilis, MD 06/28/18 1137

## 2018-12-23 ENCOUNTER — Encounter (HOSPITAL_COMMUNITY): Payer: Self-pay

## 2018-12-23 ENCOUNTER — Other Ambulatory Visit: Payer: Self-pay

## 2018-12-23 ENCOUNTER — Emergency Department (HOSPITAL_COMMUNITY)
Admission: EM | Admit: 2018-12-23 | Discharge: 2018-12-23 | Disposition: A | Discharge status: Home / Self Care | Payer: Managed Care, Other (non HMO) | Attending: Emergency Medicine | Admitting: Emergency Medicine

## 2018-12-23 DIAGNOSIS — Y9389 Activity, other specified: Secondary | ICD-10-CM | POA: Insufficient documentation

## 2018-12-23 DIAGNOSIS — J45909 Unspecified asthma, uncomplicated: Secondary | ICD-10-CM | POA: Insufficient documentation

## 2018-12-23 DIAGNOSIS — S0501XA Injury of conjunctiva and corneal abrasion without foreign body, right eye, initial encounter: Secondary | ICD-10-CM | POA: Diagnosis not present

## 2018-12-23 DIAGNOSIS — X58XXXA Exposure to other specified factors, initial encounter: Secondary | ICD-10-CM | POA: Diagnosis not present

## 2018-12-23 DIAGNOSIS — Y999 Unspecified external cause status: Secondary | ICD-10-CM | POA: Diagnosis not present

## 2018-12-23 DIAGNOSIS — Y929 Unspecified place or not applicable: Secondary | ICD-10-CM | POA: Insufficient documentation

## 2018-12-23 DIAGNOSIS — F17210 Nicotine dependence, cigarettes, uncomplicated: Secondary | ICD-10-CM | POA: Diagnosis not present

## 2018-12-23 DIAGNOSIS — Z79899 Other long term (current) drug therapy: Secondary | ICD-10-CM | POA: Diagnosis not present

## 2018-12-23 DIAGNOSIS — S0591XA Unspecified injury of right eye and orbit, initial encounter: Secondary | ICD-10-CM | POA: Diagnosis present

## 2018-12-23 MED ORDER — TOBRAMYCIN 0.3 % OP SOLN: 1.0000 [drp] | OPHTHALMIC | 0 refills | Status: AC

## 2018-12-23 MED ORDER — HYDROCODONE-ACETAMINOPHEN 5-325 MG PO TABS: 1.0000 | ORAL_TABLET | Freq: Four times a day (QID) | ORAL | 0 refills | Status: DC | PRN

## 2018-12-23 MED ORDER — TETRACAINE HCL 0.5 % OP SOLN
2.0000 [drp] | Freq: Once | OPHTHALMIC | Status: AC
  Administered 2018-12-23: 08:00:00 2 [drp] via OPHTHALMIC
  Filled 2018-12-23: qty 4

## 2018-12-23 MED ORDER — FLUORESCEIN SODIUM 1 MG OP STRP
1.0000 | ORAL_STRIP | Freq: Once | OPHTHALMIC | Status: AC
  Administered 2018-12-23: 1 via OPHTHALMIC
  Filled 2018-12-23: qty 1

## 2018-12-23 NOTE — Discharge Instructions (Signed)
Schedule to see the Opthomologist for recheck if symptoms persist

## 2018-12-23 NOTE — ED Notes (Signed)
Pt has RT eye redness, pain 8/10 and drainage, light sensitivity, and rt eye lid swelling. Pt reports that he was working in [employer],

## 2018-12-23 NOTE — ED Triage Notes (Addendum)
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 Reports waking up today with pressure and drainage. Pt states he tried to rinse out eye at home with no relief. Notable redness to eye, difficulty opening.

## 2018-12-23 NOTE — ED Provider Notes (Signed)
[facility]-EMERGENCY DEPT Provider Note   CSN: [id number] Arrival date & time: 12/23/18  0301     History   Chief Complaint Chief Complaint  Patient presents with  . Eye Problem    Right     HPI Dennis Salinas is a 33 y.o. male.     The history is provided by the patient. No language interpreter was used.  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 Asthma     There are no active problems to display for this patient.   Past Surgical History:  Procedure Laterality Date  . HERNIA REPAIR          Home Medications    Prior to Admission medications   Medication Sig Start Date End Date Taking? Authorizing Provider  albuterol (PROVENTIL HFA;VENTOLIN HFA) 108 (90 Base) MCG/ACT inhaler Inhale 1-2 puffs into the lungs every 6 (six) hours as needed for wheezing or shortness of breath. 06/28/18   Liberty Handy, PA-C  HYDROcodone-acetaminophen (NORCO/VICODIN) 5-325 MG tablet Take 1 tablet by mouth every 6 (six) hours as needed for moderate pain. 12/23/18 12/23/19  Elson Areas, PA-C  ipratropium (ATROVENT) 0.03 % nasal spray Place 2 sprays into both nostrils every 12 (twelve) hours. 06/28/18   Liberty Handy, PA-C  tobramycin (TOBREX) 0.3 % ophthalmic solution Place 1 drop into the right eye every 4 (four) hours for 10 days. 12/23/18 01/02/19  Elson Areas, PA-C    Family History No family history on file.  Social History Social History   Tobacco Use  . Smoking status: Current Every Day Smoker    Packs/day: 1.00    Types:  Cigarettes  . Smokeless tobacco: Never Used  Substance Use Topics  . Alcohol use: Yes    Comment: 40 oz every few days  . Drug use: Yes    Types: Marijuana     Allergies   Patient has no known allergies.   Review of Systems Review of Systems  Eyes: Positive for pain and redness.  All other systems reviewed and are negative.    Physical Exam Updated Vital Signs BP (!) 167/96 (BP Location: Left Arm)   Pulse 66   Temp 98.3 F (36.8 C) (Oral)   Resp 17   SpO2 98%   Physical Exam Vitals signs and nursing note reviewed.  HENT:     Head: Normocephalic.  Eyes:     Extraocular Movements: Extraocular movements intact.     Pupils: Pupils are equal, round, and reactive to light.     Comments: Injected right conjunctiva,  Tearing,  fluro no uptake,  I swabbed lids no foreign body, irrigated with saline. 2 small area of uptake  Possible abrasions   Cardiovascular:     Rate and Rhythm: Normal rate.     Pulses: Normal pulses.  Musculoskeletal: Normal range of motion.  Skin:    General: Skin is warm.  Neurological:     General: No focal deficit present.     Mental Status: He is alert.  Psychiatric:  Mood and Affect: Mood normal.      ED Treatments / Results  Labs (all labs ordered are listed, but only abnormal results are displayed) Labs Reviewed - No data to display  EKG None  Radiology No results found.  Procedures Procedures (including critical care time)  Medications Ordered in ED Medications  tetracaine (PONTOCAINE) 0.5 % ophthalmic solution 2 drop (2 drops Right Eye Given 12/23/18 0745)  fluorescein ophthalmic strip 1 strip (1 strip Right Eye Given 12/23/18 0745)     Initial Impression / Assessment and Plan / ED Course  I have reviewed the triage vital signs and the nursing notes.  Pertinent labs & imaging results that were available during my care of the patient were reviewed by me and considered in my medical decision making (see chart for  details).        MDM  I doubt foreign body,  I suspect abrasions are cause of symptoms.  Pt given rx for pain medication and tobrex opth drops.  Pt advised to call Opthomology to be seen tomorrow if not improving  Final Clinical Impressions(s) / ED Diagnoses   Final diagnoses:  Abrasion of right cornea, initial encounter    ED Discharge Orders         Ordered    tobramycin (TOBREX) 0.3 % ophthalmic solution  Every 4 hours     12/23/18 0819    HYDROcodone-acetaminophen (NORCO/VICODIN) 5-325 MG tablet  Every 6 hours PRN     12/23/18 0819        An After Visit Summary was printed and given to the patient.   Fransico Meadow, [place] 12/23/18 4034    Quintella Reichert, MD 12/23/18 [phone]

## 2019-02-25 ENCOUNTER — Other Ambulatory Visit: Payer: Self-pay

## 2019-02-25 ENCOUNTER — Encounter (HOSPITAL_COMMUNITY): Payer: Self-pay

## 2019-02-25 ENCOUNTER — Ambulatory Visit (HOSPITAL_COMMUNITY)
Admission: EM | Admit: 2019-02-25 | Discharge: 2019-02-25 | Disposition: A | Discharge status: Home / Self Care | Payer: Managed Care, Other (non HMO) | Attending: Family Medicine | Admitting: Family Medicine

## 2019-02-25 DIAGNOSIS — U071 COVID-19: Secondary | ICD-10-CM | POA: Insufficient documentation

## 2019-02-25 LAB — POC SARS CORONAVIRUS 2 AG: SARS Coronavirus 2 Ag: POSITIVE — AB

## 2019-02-25 LAB — POC SARS CORONAVIRUS 2 AG -  ED: SARS Coronavirus 2 Ag: POSITIVE — AB

## 2019-02-25 MED ORDER — ALBUTEROL SULFATE HFA 108 (90 BASE) MCG/ACT IN AERS: 1.0000 | INHALATION_SPRAY | Freq: Four times a day (QID) | RESPIRATORY_TRACT | 0 refills | Status: DC | PRN

## 2019-02-25 NOTE — Discharge Instructions (Signed)
Your rapid covid test today is positive for Covid-19 virus.  This is a viral illness which does not have a specific treatment for, unfortunately.  Push fluids to ensure adequate hydration and keep secretions thin.  Tylenol and/or ibuprofen as needed for pain or fevers.  You may try some vitamins to help your immune system potentially:  Vitamin C 500mg twice a day. Zing 50mg daily. Vitamin D 5000IU daily.   Rest.  Per CDC guidelines you should self isolate for 10 days from the onset of symptoms, and may return to work after 10 days of isolation as long as symptoms have began to improve and you have not had a fever for 3 days.  If develop any worsening of shortness of breath , chest pain , or difficulty breathing please seek further evaluation in the ER.   

## 2019-02-25 NOTE — ED Provider Notes (Signed)
[facility]    CSN: [id number] Arrival date & time: 02/25/19  1032      History   Chief Complaint Chief Complaint  Patient presents with  . Nasal Congestion    HPI Dennis Salinas is a 33 y.o. male.   Dennis Salinas presents with complaints of body aches, diarrhea, confusion intermittently or feeling "out of it."  Loss of taste and smell. Started symptoms approximately 12/1. No headache. Cough, non productive. Nasal drainage. Some sore throat. No ear pain. No known fevers. History of asthma, he ran out of his inhaler. Has been hospitalized due to asthma in the past. Where he works there has been two cases of covid-19. Hasn't gotten a flu vaccine. Hasn't taken any medications for symptoms.    ROS per HPI, negative if not otherwise mentioned.      Past Medical History:  Diagnosis Date  . Asthma     There are no active problems to display for this patient.   Past Surgical History:  Procedure Laterality Date  . HERNIA REPAIR         Home Medications    Prior to Admission medications   Medication Sig Start Date End Date Taking? Authorizing Provider  albuterol (PROVENTIL HFA;VENTOLIN HFA) 108 (90 Base) MCG/ACT inhaler Inhale 1-2 puffs into the lungs every 6 (six) hours as needed for wheezing or shortness of breath. 06/28/18   Liberty Handy, PA-C  HYDROcodone-acetaminophen (NORCO/VICODIN) 5-325 MG tablet Take 1 tablet by mouth every 6 (six) hours as needed for moderate pain. 12/23/18 12/23/19  Elson Areas, PA-C  ipratropium (ATROVENT) 0.03 % nasal spray Place 2 sprays into both nostrils every 12 (twelve) hours. 06/28/18   Liberty Handy, PA-C    Family History Family History  Problem Relation Age of Onset  . Diabetes Mother   . Diabetes Father     Social History Social History   Tobacco Use  . Smoking status: Current Every Day Smoker    Packs/day: 1.00    Types: Cigarettes  . Smokeless tobacco: Never Used  Substance Use Topics  .  Alcohol use: Yes    Comment: 40 oz every few days  . Drug use: Yes    Types: Marijuana     Allergies   Patient has no known allergies.   Review of Systems Review of Systems   Physical Exam Triage Vital Signs ED Triage Vitals  Enc Vitals Group     BP 02/25/19 1155 132/83     Pulse Rate 02/25/19 1155 60     Resp 02/25/19 1155 19     Temp 02/25/19 1155 99 F (37.2 C)     Temp Source 02/25/19 1155 Oral     SpO2 02/25/19 1155 99 %     Weight 02/25/19 1152 250 lb (113.4 kg)     Height --      Head Circumference --      Peak Flow --      Pain Score 02/25/19 1151 2     Pain Loc --      Pain Edu? --      Excl. in GC? --    No data found.  Updated Vital Signs BP 132/83 (BP Location: Left Arm)   Pulse 60   Temp 99 F (37.2 C) (Oral)   Resp 19   Wt 250 lb (113.4 kg)   SpO2 99%   BMI 31.25 kg/m   Visual Acuity Right Eye Distance:   Left Eye Distance:   Bilateral  Distance:    Right Eye Near:   Left Eye Near:    Bilateral Near:     Physical Exam Constitutional:      Appearance: He is well-developed.  Cardiovascular:     Rate and Rhythm: Normal rate.  Pulmonary:     Effort: Pulmonary effort is normal.  Skin:    General: Skin is warm and dry.  Neurological:     Mental Status: He is alert and oriented to person, place, and time.      UC Treatments / Results  Labs (all labs ordered are listed, but only abnormal results are displayed) Labs Reviewed  POC SARS CORONAVIRUS 2 AG -  ED - Abnormal; Notable for the following components:      Result Value   SARS Coronavirus 2 Ag POSITIVE (*)    All other components within normal limits  POC SARS CORONAVIRUS 2 AG - Abnormal; Notable for the following components:   SARS Coronavirus 2 Ag POSITIVE (*)    All other components within normal limits    EKG   Radiology No results found.  Procedures Procedures (including critical care time)  Medications Ordered in UC Medications - No data to display  Initial  Impression / Assessment and Plan / UC Course  I have reviewed the triage vital signs and the nursing notes.  Pertinent labs & imaging results that were available during my care of the patient were reviewed by me and considered in my medical decision making (see chart for details).     Non toxic. Benign physical exam. Positive rapid covid-19 testing today, consistent with history. Isolation discussed. Supportive cares recommended. Return precautions provided. Patient verbalized understanding and agreeable to plan.   Final Clinical Impressions(s) / UC Diagnoses   Final diagnoses:  [id number] virus infection     Discharge Instructions     Your rapid covid test today is positive for Covid-19 virus.  This is a viral illness which does not have a specific treatment for, unfortunately.  Push fluids to ensure adequate hydration and keep secretions thin.  Tylenol and/or ibuprofen as needed for pain or fevers.  You may try some vitamins to help your immune system potentially:  Vitamin C 500mg  twice a day. Zing 50mg  daily. Vitamin D 5000IU daily.   Rest.  Per CDC guidelines you should self isolate for 10 days from the onset of symptoms, and may return to work after 10 days of isolation as long as symptoms have began to improve and you have not had a fever for 3 days.  If develop any worsening of shortness of breath , chest pain , or difficulty breathing please seek further evaluation in the ER.     ED Prescriptions    None     PDMP not reviewed this encounter.   Zigmund Gottron, NP 02/25/19 1303

## 2019-02-25 NOTE — ED Triage Notes (Signed)
Pt. States he has been feeling bad since Monday, his symptoms include congestion, diahera, runny nose, loss of appetite/smell.

## 2019-02-27 LAB — NOVEL CORONAVIRUS, NAA (HOSP ORDER, SEND-OUT TO REF LAB; TAT 18-24 HRS): SARS-CoV-2, NAA: DETECTED — AB

## 2019-06-10 ENCOUNTER — Other Ambulatory Visit: Payer: Self-pay

## 2019-06-10 ENCOUNTER — Emergency Department (HOSPITAL_COMMUNITY)
Admission: EM | Admit: 2019-06-10 | Discharge: 2019-06-10 | Disposition: A | Discharge status: Home / Self Care | Payer: Self-pay | Attending: Emergency Medicine | Admitting: Emergency Medicine

## 2019-06-10 ENCOUNTER — Emergency Department (HOSPITAL_COMMUNITY): Payer: Self-pay

## 2019-06-10 DIAGNOSIS — R200 Anesthesia of skin: Secondary | ICD-10-CM | POA: Insufficient documentation

## 2019-06-10 DIAGNOSIS — R42 Dizziness and giddiness: Secondary | ICD-10-CM | POA: Insufficient documentation

## 2019-06-10 DIAGNOSIS — R531 Weakness: Secondary | ICD-10-CM | POA: Diagnosis not present

## 2019-06-10 DIAGNOSIS — R519 Headache, unspecified: Secondary | ICD-10-CM

## 2019-06-10 DIAGNOSIS — F17210 Nicotine dependence, cigarettes, uncomplicated: Secondary | ICD-10-CM | POA: Insufficient documentation

## 2019-06-10 DIAGNOSIS — Z79899 Other long term (current) drug therapy: Secondary | ICD-10-CM | POA: Insufficient documentation

## 2019-06-10 DIAGNOSIS — G4489 Other headache syndrome: Secondary | ICD-10-CM | POA: Diagnosis not present

## 2019-06-10 DIAGNOSIS — I10 Essential (primary) hypertension: Secondary | ICD-10-CM | POA: Diagnosis not present

## 2019-06-10 DIAGNOSIS — J45909 Unspecified asthma, uncomplicated: Secondary | ICD-10-CM | POA: Insufficient documentation

## 2019-06-10 DIAGNOSIS — I160 Hypertensive urgency: Secondary | ICD-10-CM | POA: Insufficient documentation

## 2019-06-10 DIAGNOSIS — R29810 Facial weakness: Secondary | ICD-10-CM | POA: Diagnosis not present

## 2019-06-10 LAB — CBC WITH DIFFERENTIAL/PLATELET
Abs Immature Granulocytes: 0.01 10*3/uL (ref 0.00–0.07)
Basophils Absolute: 0.1 10*3/uL (ref 0.0–0.1)
Basophils Relative: 1 %
Eosinophils Absolute: 0.1 10*3/uL (ref 0.0–0.5)
Eosinophils Relative: 2 %
HCT: 45.1 % (ref 39.0–52.0)
Hemoglobin: 14.9 g/dL (ref 13.0–17.0)
Immature Granulocytes: 0 %
Lymphocytes Relative: 43 %
Lymphs Abs: 2.5 10*3/uL (ref 0.7–4.0)
MCH: 31.2 pg (ref 26.0–34.0)
MCHC: 33 g/dL (ref 30.0–36.0)
MCV: 94.5 fL (ref 80.0–100.0)
Monocytes Absolute: 0.7 10*3/uL (ref 0.1–1.0)
Monocytes Relative: 12 %
Neutro Abs: 2.5 10*3/uL (ref 1.7–7.7)
Neutrophils Relative %: 42 %
Platelets: 242 10*3/uL (ref 150–400)
RBC: 4.77 MIL/uL (ref 4.22–5.81)
RDW: 12.9 % (ref 11.5–15.5)
WBC: 5.8 10*3/uL (ref 4.0–10.5)
nRBC: 0 % (ref 0.0–0.2)

## 2019-06-10 LAB — COMPREHENSIVE METABOLIC PANEL
ALT: 32 U/L (ref 0–44)
AST: 28 U/L (ref 15–41)
Albumin: 4.1 g/dL (ref 3.5–5.0)
Alkaline Phosphatase: 39 U/L (ref 38–126)
Anion gap: 11 (ref 5–15)
BUN: 8 mg/dL (ref 6–20)
CO2: 23 mmol/L (ref 22–32)
Calcium: 9.5 mg/dL (ref 8.9–10.3)
Chloride: 104 mmol/L (ref 98–111)
Creatinine, Ser: 1.06 mg/dL (ref 0.61–1.24)
GFR calc Af Amer: >60 mL/min (ref >60)
GFR calc non Af Amer: >60 mL/min (ref >60)
Glucose, Bld: 107 mg/dL — ABNORMAL HIGH (ref 70–99)
Potassium: 4.3 mmol/L (ref 3.5–5.1)
Sodium: 138 mmol/L (ref 135–145)
Total Bilirubin: 0.7 mg/dL (ref 0.3–1.2)
Total Protein: 6.8 g/dL (ref 6.5–8.1)

## 2019-06-10 LAB — RAPID URINE DRUG SCREEN, HOSP PERFORMED
Amphetamines: NOT DETECTED
Barbiturates: NOT DETECTED
Benzodiazepines: NOT DETECTED
Cocaine: NOT DETECTED
Opiates: NOT DETECTED
Tetrahydrocannabinol: NOT DETECTED

## 2019-06-10 LAB — PROTIME-INR
INR: 1 (ref 0.8–1.2)
Prothrombin Time: 12.8 seconds (ref 11.4–15.2)

## 2019-06-10 MED ORDER — PROCHLORPERAZINE EDISYLATE 10 MG/2ML IJ SOLN
10.0000 mg | Freq: Once | INTRAMUSCULAR | Status: AC
  Administered 2019-06-10: 15:00:00 10 mg via INTRAVENOUS
  Filled 2019-06-10: qty 2

## 2019-06-10 MED ORDER — IOHEXOL 350 MG/ML SOLN
75.0000 mL | Freq: Once | INTRAVENOUS | Status: AC | PRN
  Administered 2019-06-10: 17:00:00 75 mL via INTRAVENOUS

## 2019-06-10 MED ORDER — DIPHENHYDRAMINE HCL 50 MG/ML IJ SOLN
25.0000 mg | Freq: Once | INTRAMUSCULAR | Status: AC
  Administered 2019-06-10: 25 mg via INTRAVENOUS
  Filled 2019-06-10: qty 1

## 2019-06-10 NOTE — ED Notes (Signed)
Pt states that numbness to L side of face and HA completely resolved at this time

## 2019-06-10 NOTE — ED Notes (Signed)
Discharge instructions discussed with Pt. Pt verbalized understanding. Pt stable and ambulatory.    

## 2019-06-10 NOTE — ED Provider Notes (Signed)
[facility] EMERGENCY DEPARTMENT Provider Note   CSN: [id number] Arrival date & time: 06/10/19  1425     History Chief Complaint  Patient presents with  . Numbness    Dennis Salinas is a 34 y.o. male.  34 y.o male with a PMH of Asthma presents to the ED via EMS from home with a chief complaint of headache.  She describes a sharp pain to the left temporal aspect of his head without any radiation.  Reports he has had the symptoms for the past 2 days.  However, this headache worsened today as he felt some left facial numbness along with felt " different "to the left side of his body.  He was found to be hypertensive by EMS with a systolic in the 160s, diastolic in the 100s.  He does not have any prior history of hypertension or migraines.Has taken allergy medication with some relieve. Today, headache came back suddenly and more severe in nature. Nausea present this morning, with mild photophobia. No vomiting, chest pain, shortness of breath.  Of note, patient does have a family history of aneurysms by his uncle.  The history is provided by the patient.  Headache Pain location:  L temporal Radiates to:  Does not radiate Severity at highest:  9/10 Onset quality:  Sudden Duration:  2 days Progression:  Worsening Chronicity:  New Associated symptoms: dizziness and numbness   Associated symptoms: no abdominal pain, no back pain, no fever, no nausea, no photophobia and no vomiting        Past Medical History:  Diagnosis Date  . Asthma     There are no problems to display for this patient.   Past Surgical History:  Procedure Laterality Date  . HERNIA REPAIR         Family History  Problem Relation Age of Onset  . Diabetes Mother   . Diabetes Father     Social History   Tobacco Use  . Smoking status: Current Every Day Smoker    Packs/day: 1.00    Types: Cigarettes  . Smokeless tobacco: Never Used  Substance Use Topics  . Alcohol use: Yes   Comment: 40 oz every few days  . Drug use: Yes    Types: Marijuana    Home Medications Prior to Admission medications   Medication Sig Start Date End Date Taking? Authorizing Provider  albuterol (VENTOLIN HFA) 108 (90 Base) MCG/ACT inhaler Inhale 1-2 puffs into the lungs every 6 (six) hours as needed for wheezing or shortness of breath. 02/25/19   Georgetta Haber, NP  HYDROcodone-acetaminophen (NORCO/VICODIN) 5-325 MG tablet Take 1 tablet by mouth every 6 (six) hours as needed for moderate pain. 12/23/18 12/23/19  Elson Areas, PA-C  ipratropium (ATROVENT) 0.03 % nasal spray Place 2 sprays into both nostrils every 12 (twelve) hours. 06/28/18   Liberty Handy, PA-C    Allergies    Patient has no known allergies.  Review of Systems   Review of Systems  Constitutional: Negative for chills and fever.  Eyes: Negative for photophobia and redness.  Respiratory: Negative for shortness of breath.   Cardiovascular: Negative for chest pain.  Gastrointestinal: Negative for abdominal pain, nausea and vomiting.  Musculoskeletal: Negative for back pain.  Neurological: Positive for dizziness, numbness and headaches. Negative for facial asymmetry.  All other systems reviewed and are negative.   Physical Exam Updated Vital Signs BP 140/78   Pulse 71   Temp 98.9 F (37.2 C) (Oral)   Resp  17   Ht 6\' 3"  (1.905 m)   Wt 127 kg   SpO2 98%   BMI 35.00 kg/m   Physical Exam Vitals and nursing note reviewed.  Constitutional:      Appearance: Normal appearance.  HENT:     Head: Normocephalic and atraumatic.     Mouth/Throat:     Mouth: Mucous membranes are moist.  Eyes:     Extraocular Movements: Extraocular movements intact.     Pupils: Pupils are equal, round, and reactive to light.  Cardiovascular:     Rate and Rhythm: Normal rate.  Pulmonary:     Effort: Pulmonary effort is normal.     Breath sounds: Normal breath sounds.  Abdominal:     General: Abdomen is flat.     Tenderness:  There is no abdominal tenderness. There is no right CVA tenderness or left CVA tenderness.  Musculoskeletal:     Cervical back: Normal range of motion and neck supple.  Skin:    General: Skin is warm and dry.  Neurological:     Mental Status: He is alert and oriented to person, place, and time.     ED Results / Procedures / Treatments   Labs (all labs ordered are listed, but only abnormal results are displayed) Labs Reviewed  COMPREHENSIVE METABOLIC PANEL - Abnormal; Notable for the following components:      Result Value   Glucose, Bld 107 (*)    All other components within normal limits  CBC WITH DIFFERENTIAL/PLATELET  PROTIME-INR  RAPID URINE DRUG SCREEN, HOSP PERFORMED    EKG EKG Interpretation  Date/Time:  Friday June 10 2019 14:33:10 EDT Ventricular Rate:  65 PR Interval:    QRS Duration: 94 QT Interval:  383 QTC Calculation: 399 R Axis:   76 Text Interpretation: Sinus rhythm RSR' in V1 or V2, probably normal variant Borderline T abnormalities, lateral leads ST-t wave abnormality Baseline wander Abnormal ECG Confirmed by Carmin Muskrat [phone]) on 06/10/2019 2:40:28 PM   Radiology CT Angio Head W or Wo Contrast  Result Date: 06/10/2019 CLINICAL DATA:  Worsening migraine with left facial numbness, elevated blood pressure EXAM: CT ANGIOGRAPHY HEAD TECHNIQUE: Multidetector CT imaging of the head was performed using the standard protocol during bolus administration of intravenous contrast. Multiplanar CT image reconstructions and MIPs were obtained to evaluate the vascular anatomy. CONTRAST:  45mL OMNIPAQUE IOHEXOL 350 MG/ML SOLN COMPARISON:  None. FINDINGS: CT HEAD Brain: No acute intracranial hemorrhage, mass effect, or edema. Gray-white differentiation is preserved. There is an age-indeterminate small vessel infarct of the left caudate head. Ventricles and sulci are normal in size and configuration. There is no extra-axial fluid collection. Vascular: Unremarkable. Skull:  Unremarkable. Sinuses: Bilateral maxillary sinus retention cysts or polyps. Orbits: Unremarkable. CTA HEAD Artifact is present Anterior circulation: Diffuse mild irregularity. Intracranial internal carotid arteries patent. Anterior and middle cerebral arteries are patent. An anterior communicating artery is identified. There is a short flow gap within the left M1 MCA. Relatively diminished opacification of more distal left MCA vessels. Posterior circulation: Diffuse mild irregularity. Intracranial vertebral arteries, basilar artery, and posterior cerebral arteries are patent. Venous sinuses: Not well evaluated IMPRESSION: No acute intracranial hemorrhage. Age-indeterminate small vessel infarct of the left caudate head. Diffuse mild irregularity of the intracranial circulation. At least a component of this is likely secondary to artifact. However, there is very short segment of high-grade stenosis of the left M1 MCA with relatively diminished flow within the more distal left MCA. Therefore, underlying vasculopathy is suspected.  Electronically Signed   By: Guadlupe Spanish M.D.   On: 06/10/2019 17:47    Procedures Procedures (including critical care time)  Medications Ordered in ED Medications  prochlorperazine (COMPAZINE) injection 10 mg (10 mg Intravenous Given 06/10/19 1511)  diphenhydrAMINE (BENADRYL) injection 25 mg (25 mg Intravenous Given 06/10/19 1511)  iohexol (OMNIPAQUE) 350 MG/ML injection 75 mL (75 mLs Intravenous Contrast Given 06/10/19 1714)    ED Course  I have reviewed the triage vital signs and the nursing notes.  Pertinent labs & imaging results that were available during my care of the patient were reviewed by me and considered in my medical decision making (see chart for details).    MDM Rules/Calculators/A&P  Patient with a past medical history presents to the ED with complaints of left-sided sharp headache for the past 2 days.  Has no prior history of migraines, no prior history  of hypertension.  Called EMS today as his headache worsened along with was accompanied by left-sided facial numbness.  Has been taking allergy medication to help with headache without improvement in his symptoms.  Arrived in the ED hypertensive with a systolic of 161/104, no tachycardia, oxygen saturation is 99% on room air.  Denies any trauma, blood thinner use, prior history of migraines.  Does have a history of aneurysms from family uncle.  During my evaluation patient is neurovascularly intact, no facial asymmetry, no droop noted, dysarthria.  Is able to follow commands and has full range of motion of all his extremities.  Rest of his exam is unremarkable. Suspicion for aneurysm versus hypertensive urgency Given headache cocktail along with will obtain CT head versus CT angio to further evaluate.  Patient reports improvement to his headache after receiving headache cocktail.  Interpretation of his labs with a CBC that is unremarkable.  PT and INR within normal limits.  CMP without any creatinine abnormalities or LFT abnormality.    CT angio showed: No acute intracranial hemorrhage. Age-indeterminate small vessel  infarct of the left caudate head.    Diffuse mild irregularity of the intracranial circulation. At least  a component of this is likely secondary to artifact. However, there  is very short segment of high-grade stenosis of the left M1 MCA with  relatively diminished flow within the more distal left MCA.  Therefore, underlying vasculopathy is suspected.     Call placed for neurology for further recommendations.  6:09 PM spoke to neurology Dr. Amada Jupiter recommended MRI brain and MRA brain for further evaluation of abnormal CT scan.  These results were discussed with patient.  He reports improvement in his symptoms.  And is agreeable with plan and management at this time.  Patient care signed out to Dr. Lockie Mola pending imaging.   Portions of this note were generated with Administrator, sports. Dictation errors may occur despite best attempts at proofreading.  Final Clinical Impression(s) / ED Diagnoses Final diagnoses:  Hypertensive urgency  Numbness    Rx / DC Orders ED Discharge Orders    None       Claude Manges, PA-C 06/10/19 2036    Virgina Norfolk, DO 06/10/19 2319    Virgina Norfolk, DO 06/10/19 2335

## 2019-06-10 NOTE — ED Triage Notes (Signed)
Pt presents from home for migraine x5 that became worse today and L facial numbness that began just prior to arrival. VAN neg. No h/o migraines, htn  188/106, 113bpm, 99%RA, ST, 18G L AC

## 2019-06-11 NOTE — Consult Note (Signed)
Requesting Physician: Dr. Lockie Mola    Chief Complaint: Headache  History obtained from: Patient and Chart    HPI:                                                                                                                                       Dennis Salinas is a 34 y.o. male past medical history of asthma presents to the emergency department with 2-day history of headache.  He describes his headache began on Wednesday, started off gradually with a headache about 1 out of 10, throbbing behind the left eye that increased to 5/10-occurred intermittently throughout the day.  He did not have any headache most of Thursday however on Friday morning had progressively worsening headache that increased up to a 9/10 which prompted him to come to the emergency department.  He also noticed facial numbness on the left side of his face. He denies any weakness, blurry vision, numbness tingling, slurred speech. Patient also states that he has been working long hours, has been drinking coffee excessively.  He has also been vaping.  Denies using any substance  ED course:  Work-up in the ED included CT head which is unremarkable and CT angiogram which showed left M1 occlusion/stenosis with distal collaterals.  An MRI brain was obtained which showed no acute findings.    Headache improved with migraine cocktail and on assessment patient endorsing 0 out of 10 headache.    Past Medical History:  Diagnosis Date   Asthma     Past Surgical History:  Procedure Laterality Date   HERNIA REPAIR      Family History  Problem Relation Age of Onset   Diabetes Mother    Diabetes Father    Social History:  reports that he has been smoking cigarettes. He has been smoking about 1.00 pack per day. He has never used smokeless tobacco. He reports current alcohol use. He reports current drug use. Drug: Marijuana.  Allergies: No Known Allergies  Medications:                                                                                                                         I reviewed home medications   ROS:  14 systems reviewed and negative except above   Examination:                                                                                                      General: Appears well-developed  Psych: Affect appropriate to situation Eyes: No scleral injection HENT: No OP obstrucion Head: Normocephalic.  Cardiovascular: Normal rate and regular rhythm.  Respiratory: Effort normal and breath sounds normal to anterior ascultation GI: Soft.  No distension. There is no tenderness.  Skin: WDI    Neurological Examination Mental Status: Alert, oriented, thought content appropriate.  Speech fluent without evidence of aphasia. Able to follow 3 step commands without difficulty. Cranial Nerves: II: Visual fields grossly normal,  III,IV, VI: ptosis not present, extra-ocular motions intact bilaterally, pupils equal, round, reactive to light and accommodation V,VII: smile symmetric, facial light touch sensation normal bilaterally VIII: hearing normal bilaterally IX,X: uvula rises symmetrically XI: bilateral shoulder shrug XII: midline tongue extension Motor: Right : Upper extremity   5/5    Left:     Upper extremity   5/5  Lower extremity   5/5     Lower extremity   5/5 Tone and bulk:normal tone throughout; no atrophy noted Sensory: Pinprick and light touch intact throughout, bilaterally Deep Tendon Reflexes: 2+ and symmetric throughout Plantars: Right: downgoing   Left: downgoing Cerebellar: normal finger-to-nose, normal rapid alternating movements and normal heel-to-shin test Gait: normal gait and station     Lab Results: Basic Metabolic Panel: Recent Labs  Lab 06/10/19 1442  NA 138  K 4.3  CL 104  CO2 23  GLUCOSE 107*  BUN 8  CREATININE 1.06   CALCIUM 9.5    CBC: Recent Labs  Lab 06/10/19 1442  WBC 5.8  NEUTROABS 2.5  HGB 14.9  HCT 45.1  MCV 94.5  PLT 242    Coagulation Studies: Recent Labs    06/10/19 1442  LABPROT 12.8  INR 1.0    Imaging: CT Angio Head W or Wo Contrast  Result Date: 06/10/2019 CLINICAL DATA:  Worsening migraine with left facial numbness, elevated blood pressure EXAM: CT ANGIOGRAPHY HEAD TECHNIQUE: Multidetector CT imaging of the head was performed using the standard protocol during bolus administration of intravenous contrast. Multiplanar CT image reconstructions and MIPs were obtained to evaluate the vascular anatomy. CONTRAST:  73mL OMNIPAQUE IOHEXOL 350 MG/ML SOLN COMPARISON:  None. FINDINGS: CT HEAD Brain: No acute intracranial hemorrhage, mass effect, or edema. Gray-white differentiation is preserved. There is an age-indeterminate small vessel infarct of the left caudate head. Ventricles and sulci are normal in size and configuration. There is no extra-axial fluid collection. Vascular: Unremarkable. Skull: Unremarkable. Sinuses: Bilateral maxillary sinus retention cysts or polyps. Orbits: Unremarkable. CTA HEAD Artifact is present Anterior circulation: Diffuse mild irregularity. Intracranial internal carotid arteries patent. Anterior and middle cerebral arteries are patent. An anterior communicating artery is identified. There is a short flow gap within the left M1 MCA. Relatively diminished opacification of more distal left MCA vessels. Posterior circulation: Diffuse mild irregularity. Intracranial vertebral arteries, basilar artery, and posterior cerebral arteries are patent.  Venous sinuses: Not well evaluated IMPRESSION: No acute intracranial hemorrhage. Age-indeterminate small vessel infarct of the left caudate head. Diffuse mild irregularity of the intracranial circulation. At least a component of this is likely secondary to artifact. However, there is very short segment of high-grade stenosis of  the left M1 MCA with relatively diminished flow within the more distal left MCA. Therefore, underlying vasculopathy is suspected. Electronically Signed   By: Guadlupe Spanish M.D.   On: 06/10/2019 17:47   MR ANGIO HEAD WO CONTRAST  Result Date: 06/10/2019 CLINICAL DATA:  Headache EXAM: MRI HEAD WITHOUT CONTRAST MRA HEAD WITHOUT CONTRAST TECHNIQUE: Multiplanar, multiecho pulse sequences of the brain and surrounding structures were obtained without intravenous contrast. Angiographic images of the head were obtained using MRA technique without contrast. COMPARISON:  None. FINDINGS: MRI HEAD Brain: There is no acute infarction or intracranial hemorrhage. There is no intracranial mass, mass effect, or edema. There is no hydrocephalus or extra-axial fluid collection. Vascular: Major vessel flow voids at the skull base are preserved. Skull and upper cervical spine: Normal marrow signal is preserved. Sinuses/Orbits: Maxillary sinus retention cysts or polyps. Orbits are unremarkable. Other: Sella is unremarkable.  Mastoid air cells are clear. MRA HEAD Intracranial internal carotid arteries are patent. Right middle and both anterior cerebral arteries are patent. There is preserved flow related enhancement of the proximal left M1 MCA and a proximal MCA branch. However, there is minimal flow related enhancement along the remainder of the M1 MCA with collateral-like appearance as well as significantly diminished flow in the remainder of the left MCA territory. Intracranial vertebral arteries, basilar artery, posterior cerebral arteries are patent. Bilateral posterior communicating arteries are present. There is no aneurysm. IMPRESSION: No acute infarction, hemorrhage, or mass. Abnormal left MCA as seen on CTA. May reflect chronic proximal M1 MCA occlusion with collateral formation, noting there is significantly diminished flow within the left MCA territory. An acute vasculopathy such as RCVS is a consideration although there  are no additional areas of involvement identified. Electronically Signed   By: Guadlupe Spanish M.D.   On: 06/10/2019 22:17   MR BRAIN WO CONTRAST  Result Date: 06/10/2019 CLINICAL DATA:  Headache EXAM: MRI HEAD WITHOUT CONTRAST MRA HEAD WITHOUT CONTRAST TECHNIQUE: Multiplanar, multiecho pulse sequences of the brain and surrounding structures were obtained without intravenous contrast. Angiographic images of the head were obtained using MRA technique without contrast. COMPARISON:  None. FINDINGS: MRI HEAD Brain: There is no acute infarction or intracranial hemorrhage. There is no intracranial mass, mass effect, or edema. There is no hydrocephalus or extra-axial fluid collection. Vascular: Major vessel flow voids at the skull base are preserved. Skull and upper cervical spine: Normal marrow signal is preserved. Sinuses/Orbits: Maxillary sinus retention cysts or polyps. Orbits are unremarkable. Other: Sella is unremarkable.  Mastoid air cells are clear. MRA HEAD Intracranial internal carotid arteries are patent. Right middle and both anterior cerebral arteries are patent. There is preserved flow related enhancement of the proximal left M1 MCA and a proximal MCA branch. However, there is minimal flow related enhancement along the remainder of the M1 MCA with collateral-like appearance as well as significantly diminished flow in the remainder of the left MCA territory. Intracranial vertebral arteries, basilar artery, posterior cerebral arteries are patent. Bilateral posterior communicating arteries are present. There is no aneurysm. IMPRESSION: No acute infarction, hemorrhage, or mass. Abnormal left MCA as seen on CTA. May reflect chronic proximal M1 MCA occlusion with collateral formation, noting there is significantly diminished flow within the  left MCA territory. An acute vasculopathy such as RCVS is a consideration although there are no additional areas of involvement identified. Electronically Signed   By:  Macy Mis M.D.   On: 06/10/2019 22:17     ASSESSMENT AND PLAN   Headache Abnormal left MCA with diminished flow in the left MCA territory Differential diagnosis:  Reversible vasoconstriction syndrome versus chronic left M1 occlusion  Recommendations -Outpatient neurology follow-up in 1 month with repeat vascular imaging -Counseled on stroke symptoms.  Urged patient to return to ED if he is experiencing any of the them -Avoid excessive caffeine  Lorenda Grecco [facility] Pager Number [phone]

## 2019-06-16 DIAGNOSIS — Z114 Encounter for screening for human immunodeficiency virus [HIV]: Secondary | ICD-10-CM | POA: Diagnosis not present

## 2019-06-16 DIAGNOSIS — Z131 Encounter for screening for diabetes mellitus: Secondary | ICD-10-CM | POA: Diagnosis not present

## 2019-06-16 DIAGNOSIS — R4182 Altered mental status, unspecified: Secondary | ICD-10-CM | POA: Diagnosis not present

## 2019-06-16 DIAGNOSIS — R519 Headache, unspecified: Secondary | ICD-10-CM | POA: Diagnosis not present

## 2019-06-16 DIAGNOSIS — Z13220 Encounter for screening for lipoid disorders: Secondary | ICD-10-CM | POA: Diagnosis not present

## 2019-07-08 DIAGNOSIS — R4182 Altered mental status, unspecified: Secondary | ICD-10-CM | POA: Diagnosis not present

## 2019-07-08 DIAGNOSIS — R7303 Prediabetes: Secondary | ICD-10-CM | POA: Diagnosis not present

## 2019-07-28 ENCOUNTER — Other Ambulatory Visit: Payer: Self-pay

## 2019-07-28 ENCOUNTER — Encounter: Payer: Self-pay | Admitting: Neurology

## 2019-07-28 ENCOUNTER — Ambulatory Visit: Payer: BC Managed Care – PPO | Admitting: Neurology

## 2019-07-28 VITALS — BP 133/80 | HR 78 | Temp 98.1°F | Ht 75.0 in | Wt 288.0 lb

## 2019-07-28 DIAGNOSIS — R200 Anesthesia of skin: Secondary | ICD-10-CM

## 2019-07-28 DIAGNOSIS — G441 Vascular headache, not elsewhere classified: Secondary | ICD-10-CM | POA: Diagnosis not present

## 2019-07-28 DIAGNOSIS — I6602 Occlusion and stenosis of left middle cerebral artery: Secondary | ICD-10-CM | POA: Diagnosis not present

## 2019-07-28 MED ORDER — TOPIRAMATE 50 MG PO TABS: 50.0000 mg | ORAL_TABLET | Freq: Two times a day (BID) | ORAL | 2 refills | Status: AC

## 2019-07-28 NOTE — Progress Notes (Signed)
[facility] [address] [place]. [place] [address] [phone]       OFFICE CONSULT NOTE  [name] Date of Birth:  Dec 08, 1985 Medical Record Number:  [id number]   Referring MD: Dr. Lorraine Lax  Reason for Referral: Headache and facial numbness and abnormal MRI  HPI: Dennis Salinas is a 34 year old African-American male seen today for initial office consultation visit.  History is obtained from the patient, review of electronic medical records and I personally viewed imaging films in PACS. Patient stated that he had Covid infection in December 2020 where he lost taste and smell and with some breathing problems but denies neurological symptoms at that time.  He started having headaches in March which are quite mild for the first couple of days and then gradually started becoming more bothersome.  These were more on the left temporal region 5/10 in severity and occurring intermittently.  He also noticed some numbness on his left side of his face and cheeks.  These would last about 45 minutes to an hour.  He would make them go away if he try to sleep.  Did not try taking any medications.  He denied drinking excessive soda coffee no protein intake drinks.  He has no prior history of migraine headaches or family history of migraines.  He denied using stimulants or cough and cold medications.  He has no history of sickle cell disease, DVT, pulmonary embolism or previous strokes or seizures.  Patient was seen in the ER on 06/10/2019 and had MRI scan brain was unremarkable except for maxillary chronic inflammatory changes and polyps.  MRI of the brain showed preserved signal in the proximal M1 segment with poor signal signal in distal middle cerebral artery on the left.  Patient denied any corresponding left hemispheric stroke or TIA symptoms.  It was unclear whether this represented changes of reversible cerebral vasoconstriction syndrome or chronic occlusion.  Patient denies  significant vascular risk factors in the form of hypertension, diabetes, hyperlipidemia, smoking, drug abuse or heavy alcohol intake.  There is no family history of strokes or heart attacks in a young age.  He has no prior history of sickle cell disease.  He states that he is continued to have intermittent headaches about couple of days a week along with numbness in the face.  Interestingly the numbness in the face and cheek seems to precede his headaches which are moderate intensity not severe and not disabling.  There is no accompanying nausea vomiting light or sound sensitivity.  However he states that he can make this go away if he tries to sleep.  He is unable to line for specific triggers for these episodes. ROS:   14 system review of systems is positive for headache, neck pain, numbness and all systems negative  PMH:  Past Medical History:  Diagnosis Date  . Asthma     Social History:  Social History   Socioeconomic History  . Marital status: Single    Spouse name: Not on file  . Number of children: Not on file  . Years of education: Not on file  . Highest education level: Not on file  Occupational History  . Not on file  Tobacco Use  . Smoking status: Never Smoker  . Smokeless tobacco: Never Used  Substance and Sexual Activity  . Alcohol use: Yes    Alcohol/week: 1.0 standard drinks    Types: 1 Glasses of wine per week  . Drug use: Not Currently    Types: Marijuana  .  Sexual activity: Not on file  Other Topics Concern  . Not on file  Social History Narrative  . Not on file   Social Determinants of Health   Financial Resource Strain:   . Difficulty of Paying Living Expenses:   Food Insecurity:   . Worried About Charity fundraiser in the Last Year:   . Arboriculturist in the Last Year:   Transportation Needs:   . Film/video editor (Medical):   Marland Kitchen Lack of Transportation (Non-Medical):   Physical Activity:   . Days of Exercise per Week:   . Minutes of Exercise  per Session:   Stress:   . Feeling of Stress :   Social Connections:   . Frequency of Communication with Friends and Family:   . Frequency of Social Gatherings with Friends and Family:   . Attends Religious Services:   . Active Member of Clubs or Organizations:   . Attends Archivist Meetings:   Marland Kitchen Marital Status:   Intimate Partner Violence:   . Fear of Current or Ex-Partner:   . Emotionally Abused:   Marland Kitchen Physically Abused:   . Sexually Abused:     Medications:   No current outpatient medications on file prior to visit.   No current facility-administered medications on file prior to visit.    Allergies:  No Known Allergies  Physical Exam General: well developed, well nourished young African-American male, seated, in no evident distress Head: head normocephalic and atraumatic.   Neck: supple with no carotid or supraclavicular bruits Cardiovascular: regular rate and rhythm, no murmurs Musculoskeletal: no deformity Skin:  no rash/petichiae Vascular:  Normal pulses all extremities  Neurologic Exam Mental Status: Awake and fully alert. Oriented to place and time. Recent and remote memory intact. Attention span, concentration and fund of knowledge appropriate. Mood and affect appropriate.  Cranial Nerves: Fundoscopic exam reveals sharp disc margins. Pupils equal, briskly reactive to light. Extraocular movements full without nystagmus. Visual fields full to confrontation. Hearing intact. Facial sensation intact. Face, tongue, palate moves normally and symmetrically.  Motor: Normal bulk and tone. Normal strength in all tested extremity muscles. Sensory.: intact to touch , pinprick , position and vibratory sensation.  Coordination: Rapid alternating movements normal in all extremities. Finger-to-nose and heel-to-shin performed accurately bilaterally. Gait and Station: Arises from chair without difficulty. Stance is normal. Gait demonstrates normal stride length and balance .  Able to heel, toe and tandem walk without difficulty.  Reflexes: 1+ and symmetric. Toes downgoing.   NIHSS 0 Modified Rankin  1  ASSESSMENT: 34 year old African-American male with new onset of headache and facial numbness of unclear etiology likely atypical migraine.  Incidental finding of left middle cerebral artery stenosis in the distal M1 segment but no clinical correlate to suggest infarction.  Possibility of reversible cerebral vasoconstriction syndrome but less likely given isolated single vessel involvement     PLAN: I had a long discussion with the patient regarding his episodes of recurrent headaches and facial numbness which likely seem like her typical migraines as well as discussed results of MRI of the brain showing left middle cerebral artery stenosis which is of unclear etiology.  I recommend further evaluation by checking MRI scan of the brain with and without contrast, repeat MRI of the brain and neck and check ESR, ANA, lipid profile, sickle cell screen and hemoglobin A1c.  Trial of Topamax 50 mg twice daily to help with facial numbness and recurrent headaches prevention.  Greater than 50% time during  this 50-minute consultation visit was spent on counseling and coordination of care about his headache, facial numbness and left MCA stenosis/occlusion and answering questions he will return for follow-up in the future in 2 months or call earlier if necessary. Antony Contras, MD  [facility] [address] [place], [place] [address]  Phone [phone] Fax [phone] Note: This document was prepared with digital dictation and possible smart phrase technology. Any transcriptional errors that result from this process are unintentional.

## 2019-07-28 NOTE — Patient Instructions (Signed)
I had a long discussion with the patient regarding his episodes of recurrent headaches and facial numbness which likely seem like her typical migraines as well as discussed results of MRI of the brain showing left middle cerebral artery stenosis which is of unclear etiology.  I recommend further evaluation by checking MRI scan of the brain with and without contrast, repeat MRI of the brain and neck and check ESR, ANA, lipid profile, sickle cell screen and hemoglobin A1c.  Trial of Topamax 50 mg twice daily to help with facial numbness and recurrent headaches prevention.  He will return for follow-up in the future in 2 months or call earlier if necessary.

## 2019-07-29 LAB — LIPID PANEL
Chol/HDL Ratio: 5 ratio (ref 0.0–5.0)
Cholesterol, Total: 146 mg/dL (ref 100–199)
HDL: 29 mg/dL — ABNORMAL LOW (ref >39)
LDL Chol Calc (NIH): 106 mg/dL — ABNORMAL HIGH (ref 0–99)
Triglycerides: 49 mg/dL (ref 0–149)
VLDL Cholesterol Cal: 11 mg/dL (ref 5–40)

## 2019-07-29 LAB — ANA COMPREHENSIVE PANEL
Anti JO-1: <0.2 AI (ref 0.0–0.9)
Centromere Ab Screen: <0.2 AI (ref 0.0–0.9)
Chromatin Ab SerPl-aCnc: <0.2 AI (ref 0.0–0.9)
ENA RNP Ab: 0.3 AI (ref 0.0–0.9)
ENA SM Ab Ser-aCnc: <0.2 AI (ref 0.0–0.9)
ENA SSA (RO) Ab: <0.2 AI (ref 0.0–0.9)
ENA SSB (LA) Ab: <0.2 AI (ref 0.0–0.9)
Scleroderma (Scl-70) (ENA) Antibody, IgG: <0.2 AI (ref 0.0–0.9)
dsDNA Ab: <1 IU/mL (ref 0–9)

## 2019-07-29 LAB — SICKLE CELL SCREEN: Sickle Cell Screen: NEGATIVE

## 2019-07-29 LAB — SEDIMENTATION RATE: Sed Rate: 8 mm/hr (ref 0–15)

## 2019-07-29 LAB — HEMOGLOBIN A1C
Est. average glucose Bld gHb Est-mCnc: 108 mg/dL
Hgb A1c MFr Bld: 5.4 % (ref 4.8–5.6)

## 2019-07-29 NOTE — Progress Notes (Signed)
Kindly inform the patient that sedimentation rate and screening test for diabetes was satisfactory.  Bad cholesterol is slightly elevated and good cholesterol is low.  He can go on low-fat diet and lose weight to correct this.

## 2019-08-01 ENCOUNTER — Telehealth: Payer: Self-pay | Admitting: *Deleted

## 2019-08-01 NOTE — Telephone Encounter (Addendum)
I spoke with the patient and discussed the lab results below per Dr Pearlean Brownie. Pt verbalized understanding and appreciation for the call. He stated he is dieting and losing weight right now. He will f/u at next ov with Dr. Pearlean Brownie 10/04/19 @ 3 pm.   ----- Message ----- From: Micki Riley, MD Sent: 07/29/2019   1:29 PM EDT  Kindly inform the patient that sedimentation rate and screening test for diabetes was satisfactory.  Bad cholesterol is slightly elevated and good cholesterol is low.  He can go on low-fat diet and lose weight to correct this.

## 2019-08-08 ENCOUNTER — Telehealth: Payer: Self-pay | Admitting: Neurology

## 2019-08-08 NOTE — Telephone Encounter (Signed)
i spoke to the patient he states he will give me a call back to schedule  [employer] Auth: [id number] (exp. 08/04/19 to 01/30/20)

## 2019-08-12 LAB — ANTIPHOSPHOLIPID SYNDROME COMP
APTT: 28.4 s
Anticardiolipin Ab, IgA: <10 [APL'U]
Anticardiolipin Ab, IgG: <10 [GPL'U]
Anticardiolipin Ab, IgM: <10 [MPL'U]
Antiphosphatidylserine IgG: 0 {GPS'U}
Antiphosphatidylserine IgM: 0 {MPS'U}
Antiprothrombin Antibody, IgG: 4 G units
Beta-2 Glycoprotein I, IgA: <10 SAU
Beta-2 Glycoprotein I, IgG: <10 SGU
Beta-2 Glycoprotein I, IgM: <10 SMU
DRVVT Confirm Seconds: 35.7 s
DRVVT Ratio: 1.2 ratio
DRVVT Screen Seconds: 47.2 s — ABNORMAL HIGH
Hexagonal Phospholipid Neutral: 0 s
Platelet Neutralization: 0 s

## 2019-08-16 NOTE — Progress Notes (Signed)
Kindly inform the patient that antiphospholipid antibody panel is normal

## 2019-10-04 ENCOUNTER — Telehealth: Payer: Self-pay

## 2019-10-04 ENCOUNTER — Encounter: Payer: Self-pay | Admitting: Neurology

## 2019-10-04 ENCOUNTER — Ambulatory Visit: Payer: BC Managed Care – PPO | Admitting: Neurology

## 2019-10-04 NOTE — Telephone Encounter (Signed)
Patient no-showed today's appointment with MD 

## 2021-10-26 IMAGING — MR MR HEAD W/O CM
6 of 11 series · 25 of 48 positions shown · non-contrast
Comparison: None.

CLINICAL DATA: Headache

EXAM:
MRI HEAD WITHOUT CONTRAST
MRA HEAD WITHOUT CONTRAST
TECHNIQUE: Multiplanar, multiecho pulse sequences of the brain and surrounding
structures were obtained without intravenous contrast. Angiographic
images of the head were obtained using MRA technique without
contrast.

[Series 3: DWI · axial · 3.0mm · 0.94mm/px · z∈[-48,+111]mm · 8 of 108 slices shown (1 of 2)]
[im 1/108]
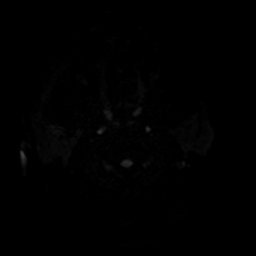
[im 16/108]
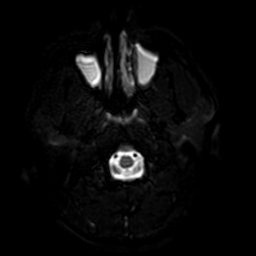
[im 31/108]
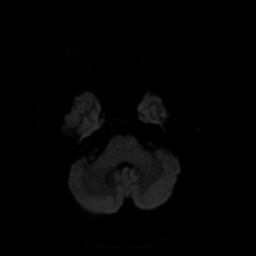
[im 46/108]
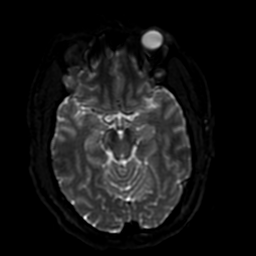
[im 62/108]
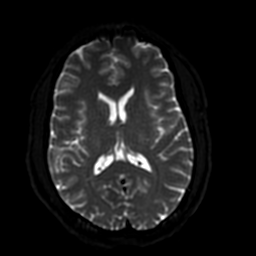
[im 77/108]
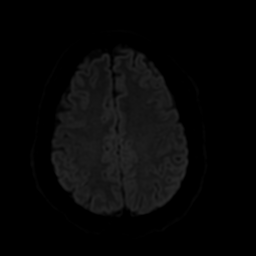
[im 92/108]
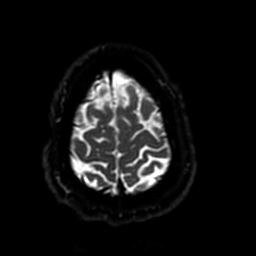
[im 108/108]
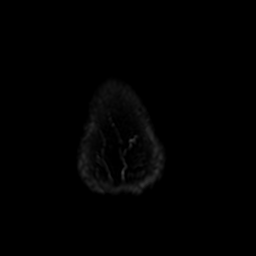

[Series 5: DWI · coronal · 4.0mm · 0.94mm/px · 6 of 86 slices shown (2 of 2)]
[im 1/86]
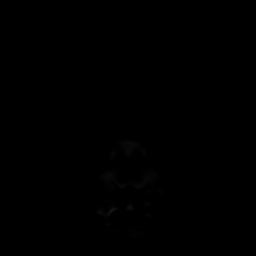
[im 18/86]
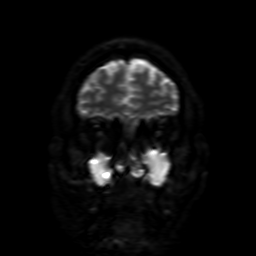
[im 35/86]
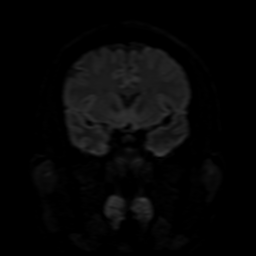
[im 52/86]
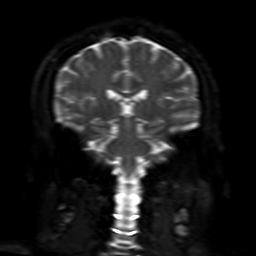
[im 69/86]
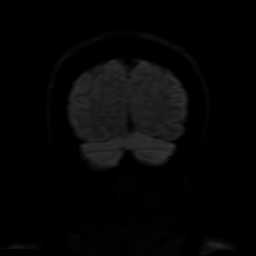
[im 86/86]
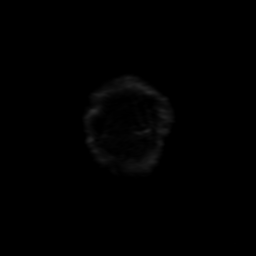

[Series 7: FLAIR · sagittal · 5.0mm · 0.23mm/px · 2 of 23 slices shown (1 of 2)]
[im 1/23]
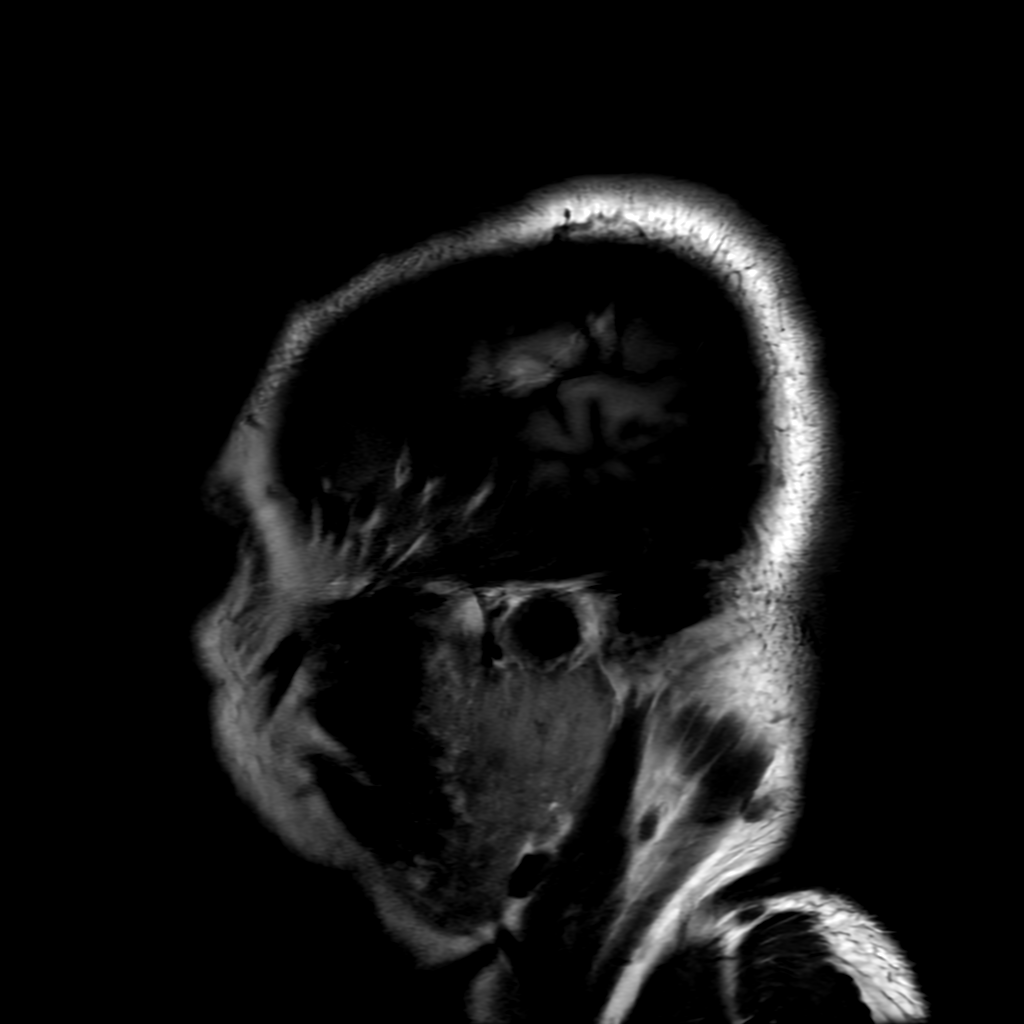
[im 23/23]
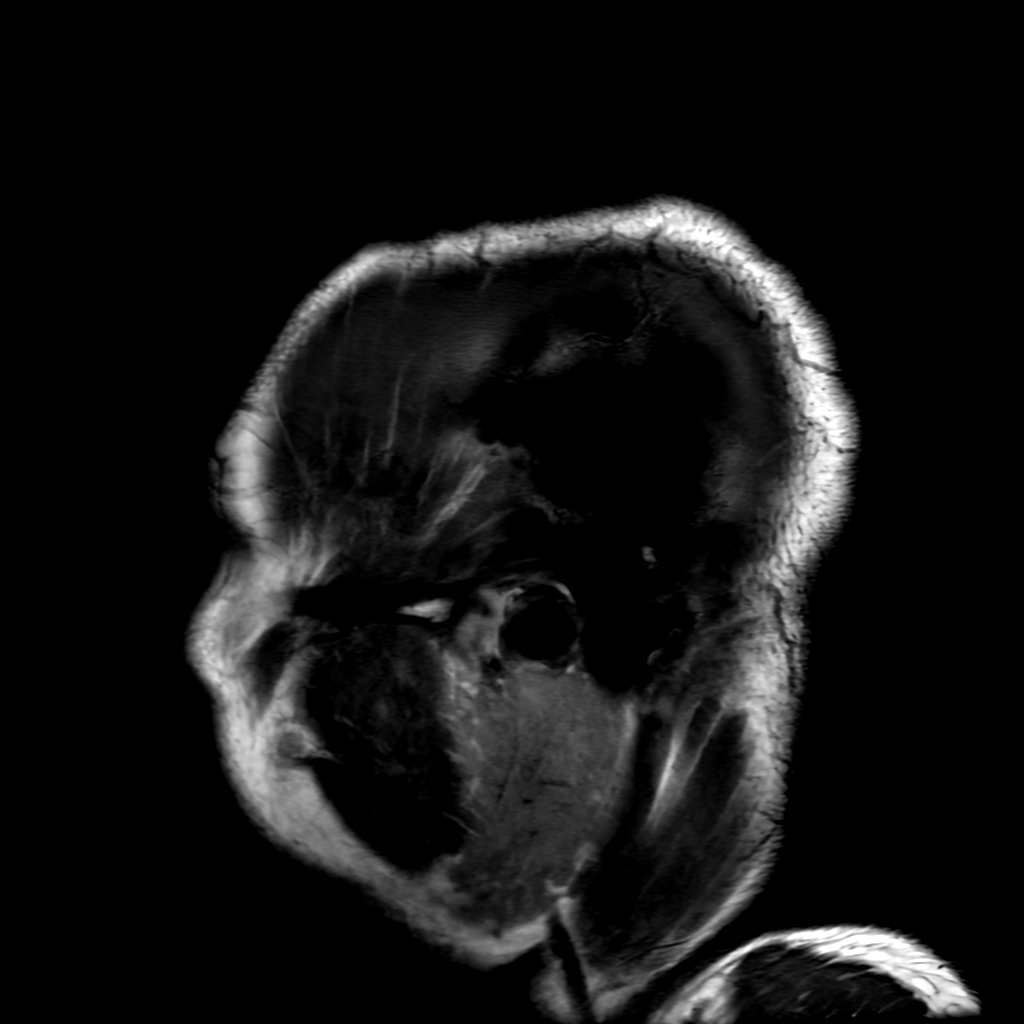

[Series 9: FLAIR · axial · 3.0mm · 0.47mm/px · z∈[-54,+96]mm · 2 of 26 slices shown (2 of 2)]
[im 1/26]
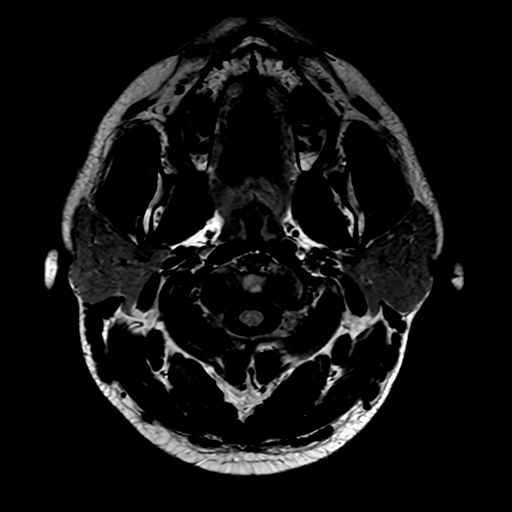
[im 26/26]
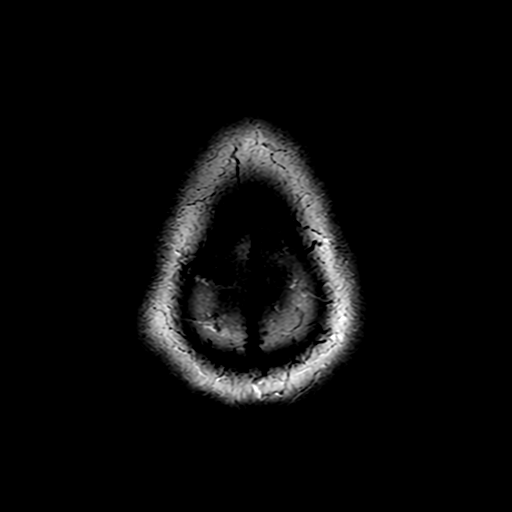

[Series 350: ADC · axial · 3.0mm · 0.94mm/px · z∈[-48,+111]mm · 4 of 52 slices shown (1 of 2)]
[im 1/52]
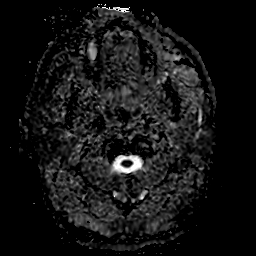
[im 18/52]
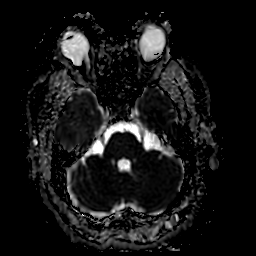
[im 35/52]
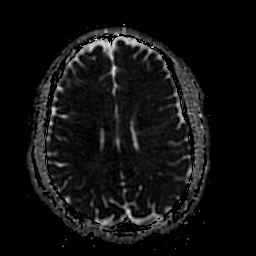
[im 52/52]
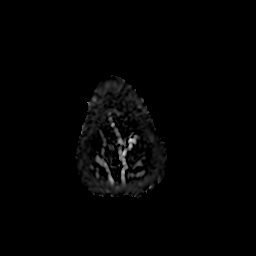

[Series 550: ADC · coronal · 4.0mm · 0.94mm/px · 3 of 43 slices shown (2 of 2)]
[im 1/43]
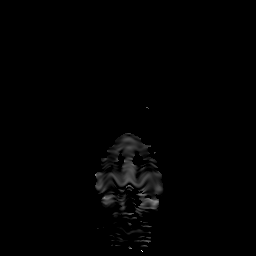
[im 22/43]
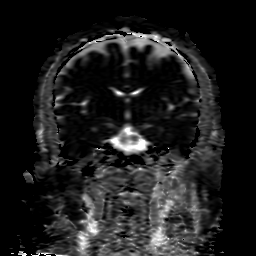
[im 43/43]
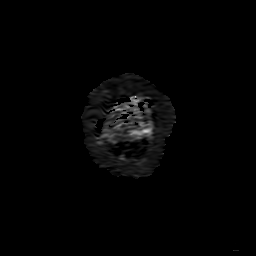

[25 of 48 positions shown; findings below may reference images not displayed]

FINDINGS: MRI HEAD

Brain: There is no acute infarction or intracranial hemorrhage.
There is no intracranial mass, mass effect, or edema. There is no
hydrocephalus or extra-axial fluid collection.

Vascular: Major vessel flow voids at the skull base are preserved.

Skull and upper cervical spine: Normal marrow signal is preserved.

Sinuses/Orbits: Maxillary sinus retention cysts or polyps. Orbits
are unremarkable.

Other: Sella is unremarkable.  Mastoid air cells are clear.

MRA HEAD

Intracranial internal carotid arteries are patent. Right middle and
both anterior cerebral arteries are patent.

There is preserved flow related enhancement of the proximal left M1
MCA and a proximal MCA branch. However, there is minimal flow
related enhancement along the remainder of the M1 MCA with
collateral-like appearance as well as significantly diminished flow
in the remainder of the left MCA territory.

Intracranial vertebral arteries, basilar artery, posterior cerebral
arteries are patent. Bilateral posterior communicating arteries are
present. There is no aneurysm.
IMPRESSION: No acute infarction, hemorrhage, or mass.

Abnormal left MCA as seen on CTA. May reflect chronic proximal M1
MCA occlusion with collateral formation, noting there is
significantly diminished flow within the left MCA territory. An
acute vasculopathy such as RCVS is a consideration although there
are no additional areas of involvement identified.
